# Patient Record
Sex: Male | Born: 2000 | Race: Black or African American | Hispanic: No | Marital: Single | Smoking: Current some day smoker
Health system: Southern US, Community
[De-identification: ages and names within clinical notes are randomized; demographics above are authoritative.]

## PROBLEM LIST (undated history)

## (undated) ENCOUNTER — Ambulatory Visit (HOSPITAL_COMMUNITY): Admission: EM | Disposition: A | Discharge status: Home / Self Care | Payer: Medicaid Other | Source: Home / Self Care

## (undated) DIAGNOSIS — A4902 Methicillin resistant Staphylococcus aureus infection, unspecified site: Secondary | ICD-10-CM

## (undated) DIAGNOSIS — L309 Dermatitis, unspecified: Secondary | ICD-10-CM

## (undated) HISTORY — PX: HERNIA REPAIR: SHX51

---
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
## 2022-08-23 IMAGING — RF DG UGI W/ HIGH DENSITY W/O KUB
7 series · 14 of 24 positions shown · non-contrast
Comparison: None.

CLINICAL DATA: Intermittent epigastric abdominal pain and burning
sensation.

EXAM:
UPPER GI SERIES WITHOUT KUB
TECHNIQUE: Routine upper GI series was performed with thin and high density
barium.
FLUOROSCOPY TIME:  Fluoroscopy Time:  2 minutes 54 seconds
Radiation Exposure Index (if provided by the fluoroscopic device):
158 mGy
Number of Acquired Spot Images: 5

[Series 1: one shot · 0.16mm/px · 5 of 12 slices shown (1 of 3)]
[im 1/12]
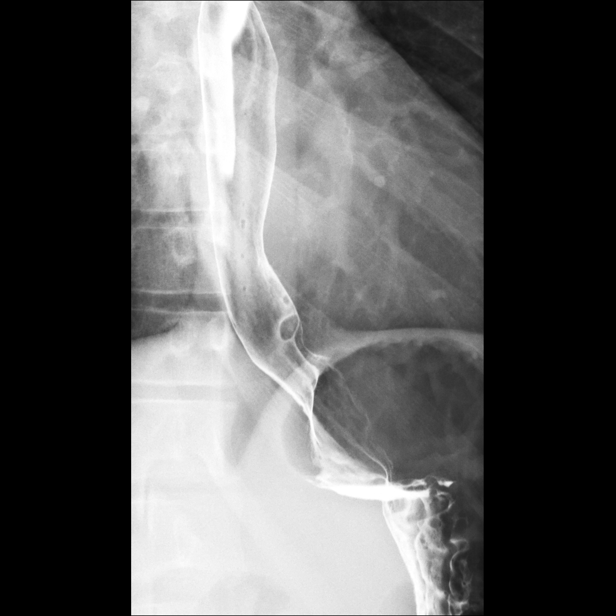
[im 4/12]
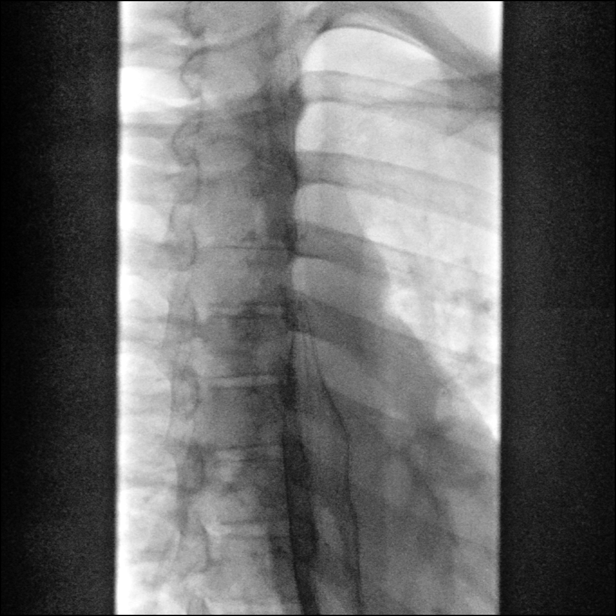
[im 7/12]
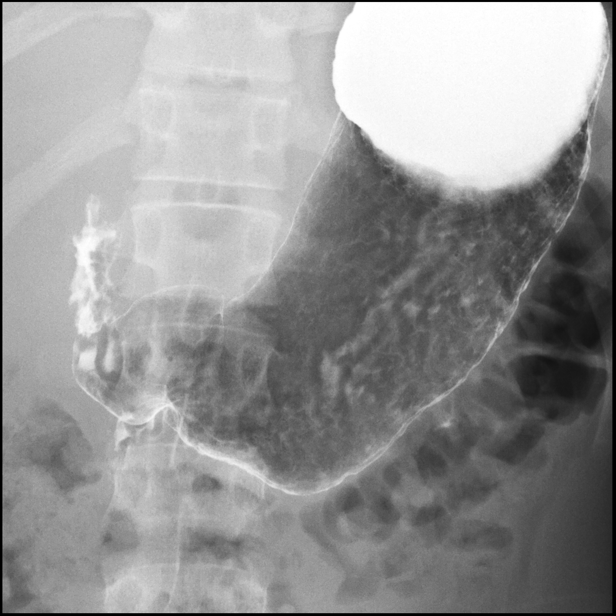
[im 9/12]
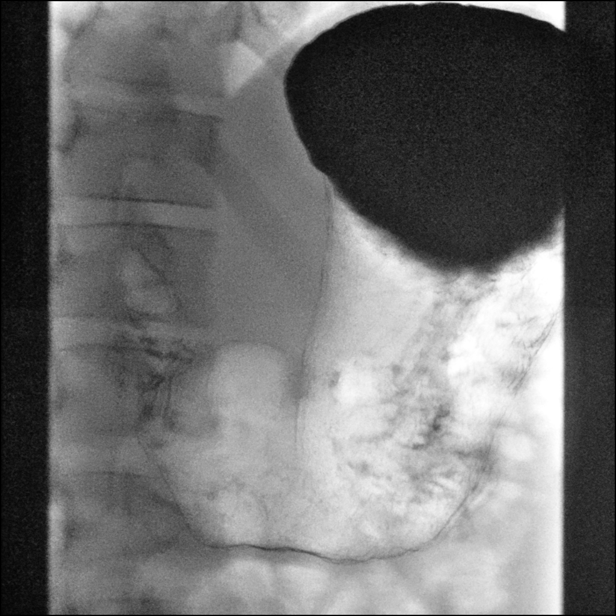
[im 11/12]
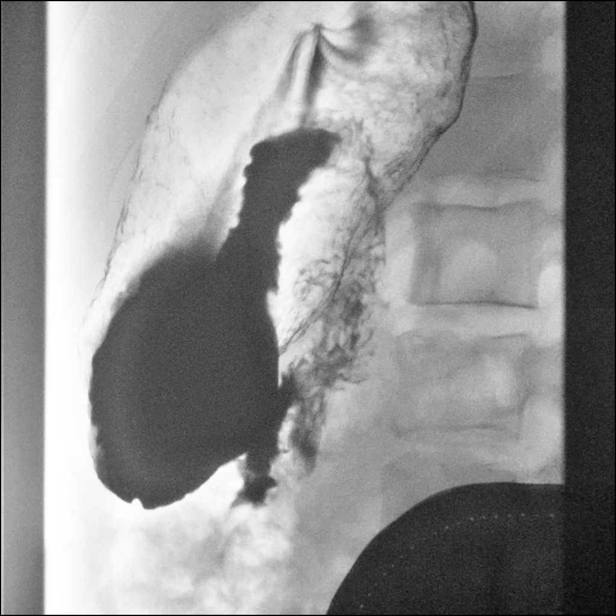

[Series 2: sequence · 2 of 30 frames shown (1 of 4)]
[frame 10/30]
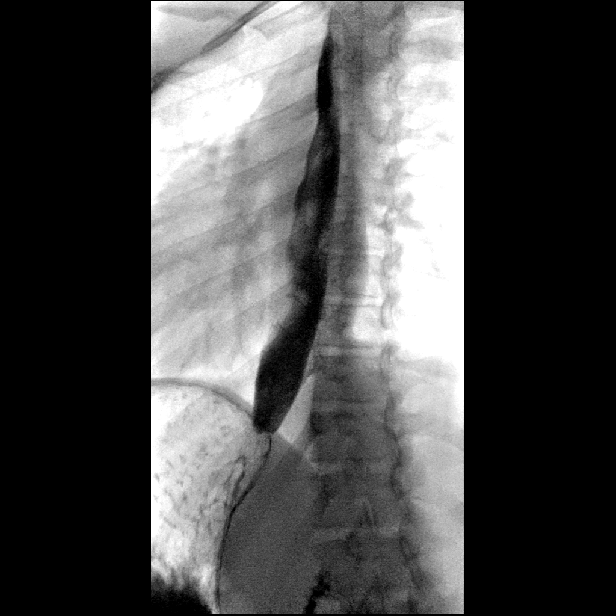
[frame 26/30]
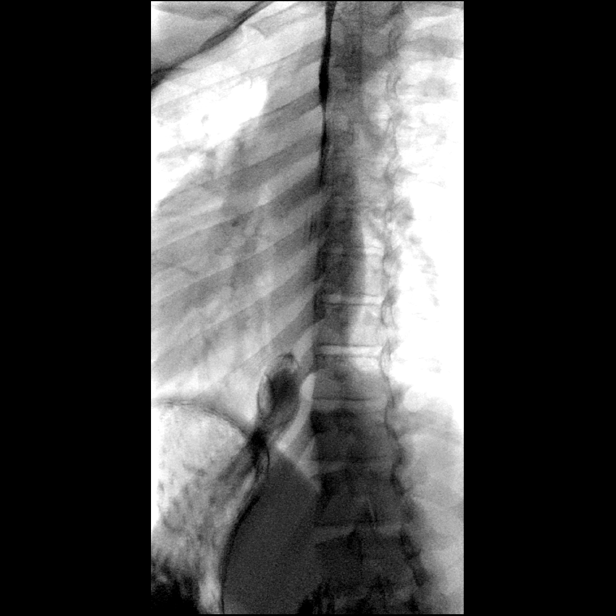

[Series 3: sequence · 2 of 100 frames shown (2 of 4)]
[frame 51/100]
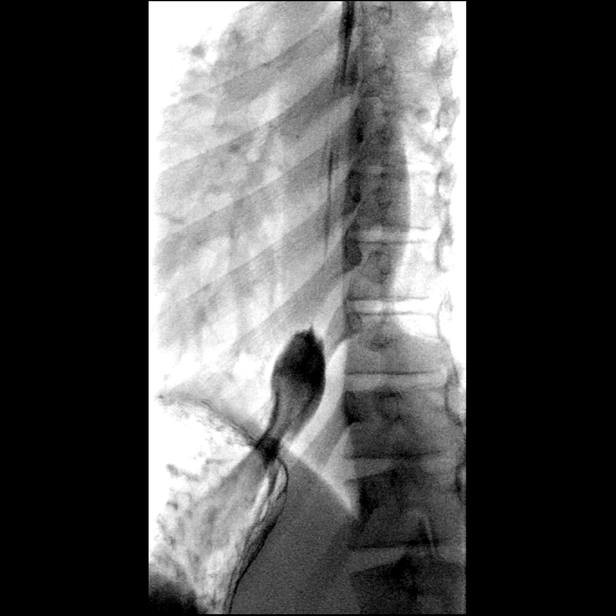
[frame 99/100]
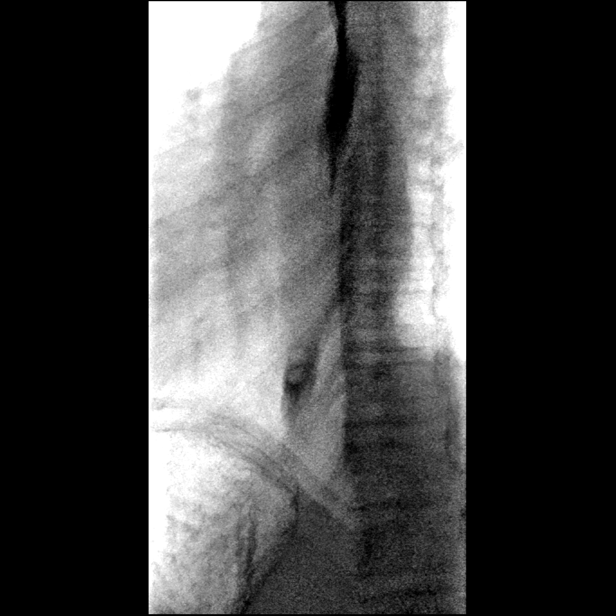

[Series 4: one shot · 1 of 3 slices shown (2 of 3)]
[im 3/3]
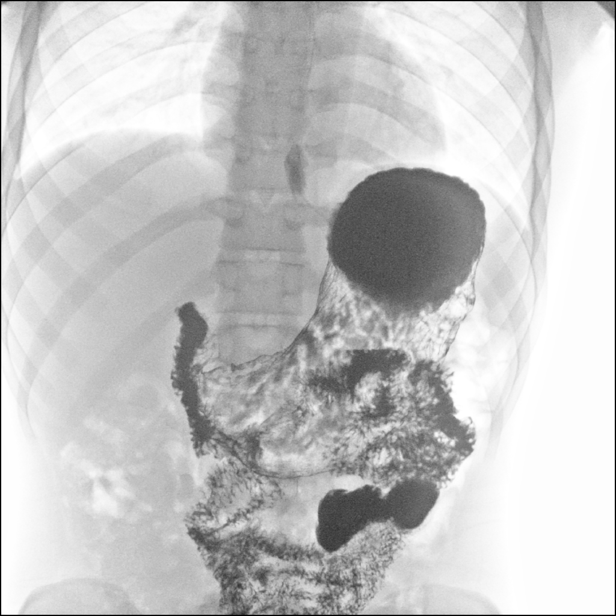

[Series 5: sequence · 2 of 35 frames shown (3 of 4)]
[frame 20/35]
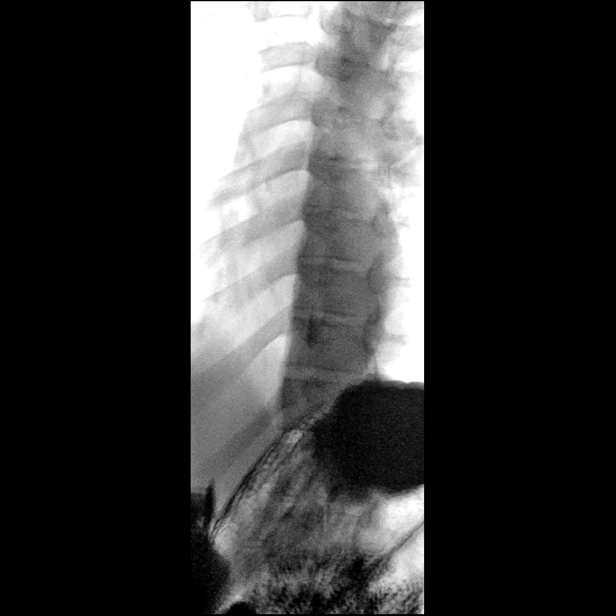
[frame 30/35]
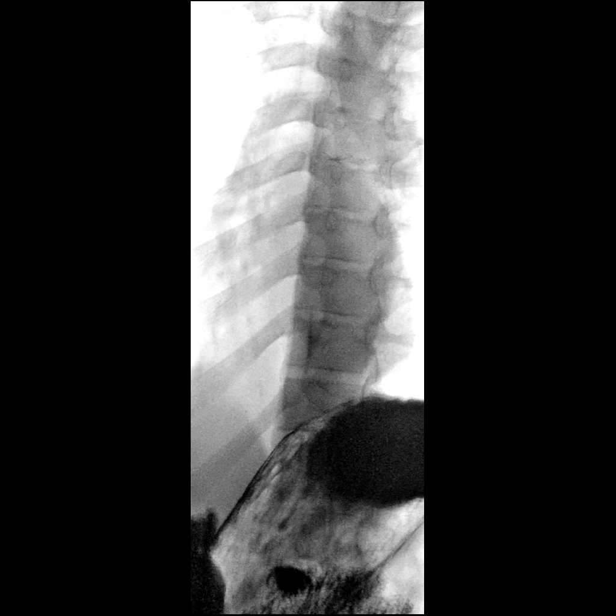

[Series 7: sequence · 1 of 39 frames shown (4 of 4)]
[frame 6/39]
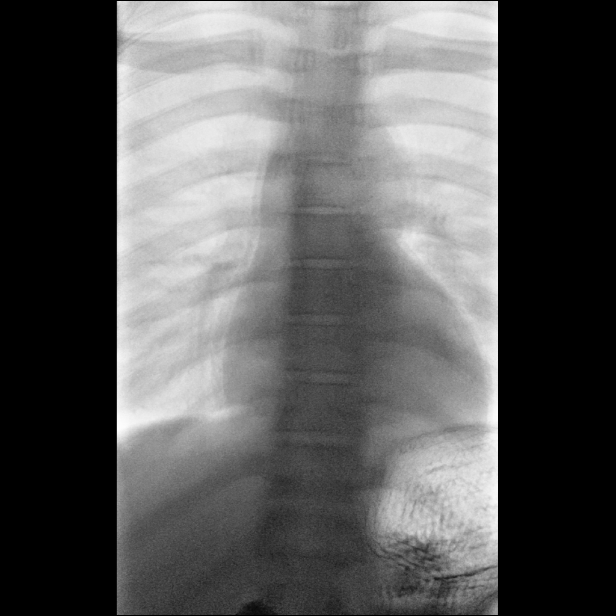

[Series 8: one shot · 1 of 1 slices shown (3 of 3)]
[im 1/1]
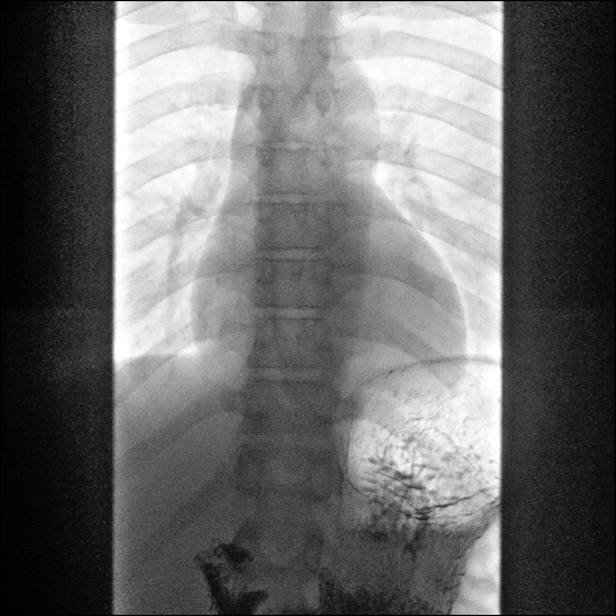

[14 of 24 positions shown; findings below may reference images not displayed]

FINDINGS: Normal esophageal motility. No hiatal hernia. Mild gastroesophageal
reflux elicited to the level of the lower thoracic esophagus with
water siphon test. Normal esophageal mucosa, with no evidence of
reflux esophagitis. Normal esophageal distensibility, with no
evidence of esophageal mass, ulcer or stricture. Barium tablet
traversed the esophagus into the stomach without delay.

No gastric fold thickening. Normal gastric emptying. No evidence of
gastric filling defects, ulcers or significant erosions. Normal
duodenal bulb and C sweep, with no evidence of duodenal fold
thickening, filling defects, ulcers or strictures. Normal duodenal
jejunal junction to the left of the spine. Visualized proximal
jejunal loops are normal caliber and without fold thickening.
IMPRESSION: 1. Mild gastroesophageal reflux elicited.  No hiatal hernia.
2. Otherwise normal upper GI. No evidence of reflux esophagitis.
Normal esophageal motility. No evidence of peptic ulcer disease.

## 2022-09-27 IMAGING — US US ABDOMEN LIMITED RUQ/ASCITES
1 series · 14 of 25 positions shown · non-contrast
Comparison: None.

CLINICAL DATA: Dyspepsia with upper abdominal pain x1 year

EXAM:
ULTRASOUND ABDOMEN LIMITED RIGHT UPPER QUADRANT

[Series 1: us abdomen limited ruq/ascites · 0.17mm/px · 14 of 37 slices shown]
[im 1/37]
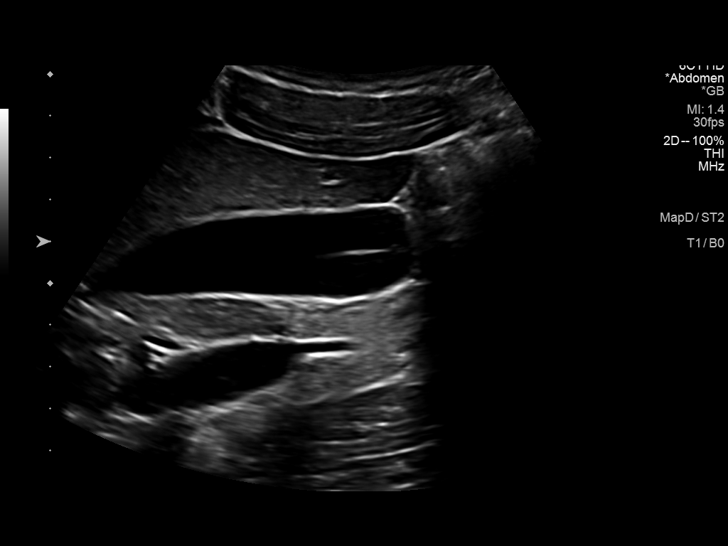
[im 4/37]
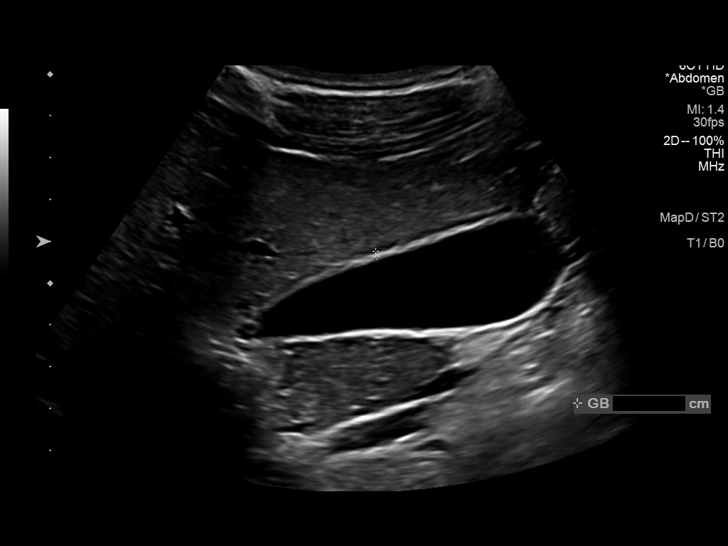
[im 7/37]
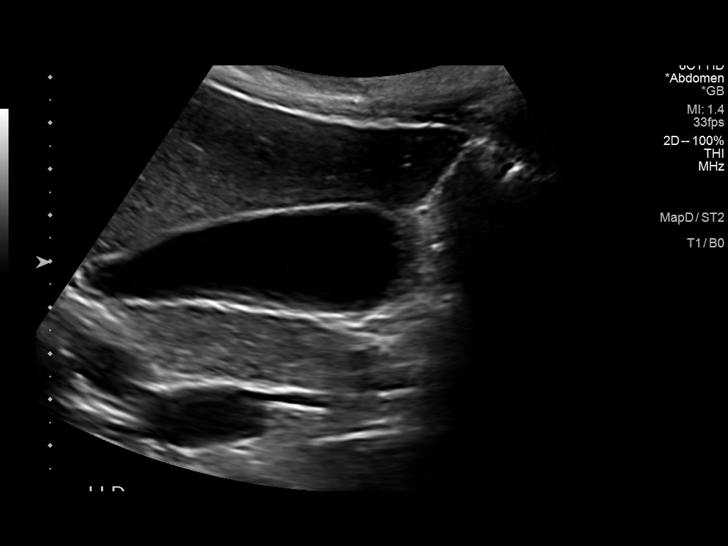
[im 10/37]
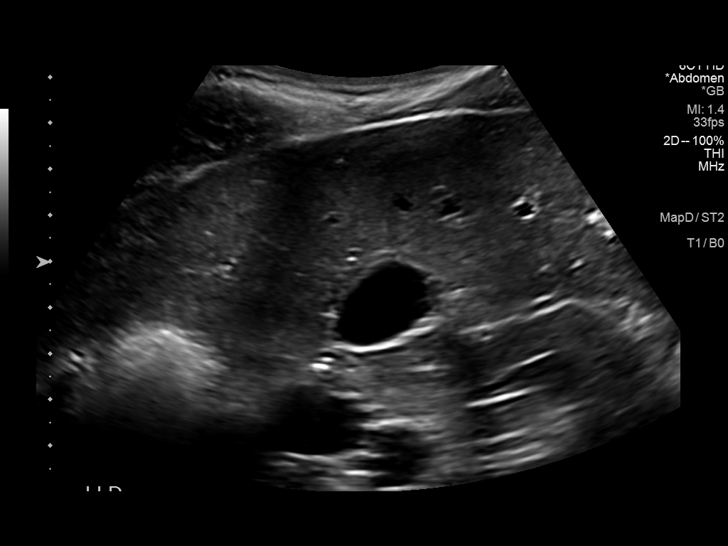
[im 13/37]
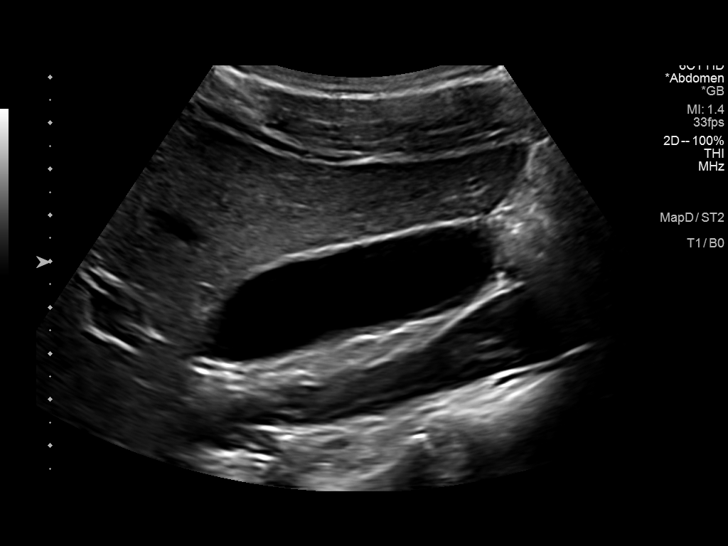
[im 14/37]
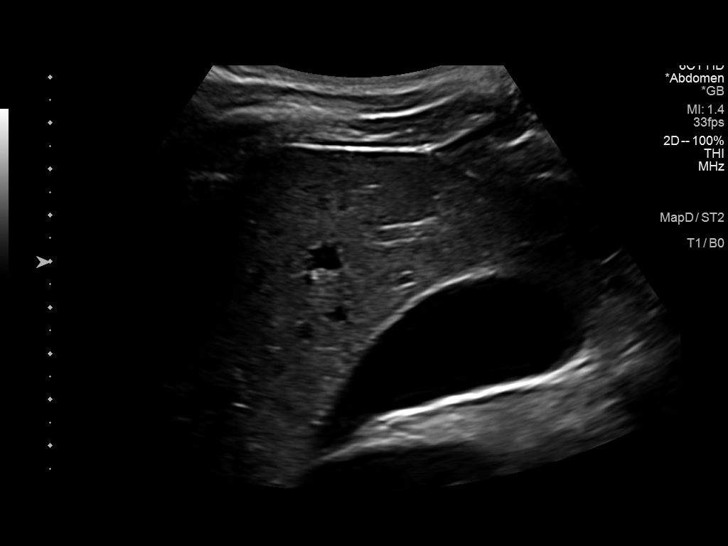
[im 17/37]
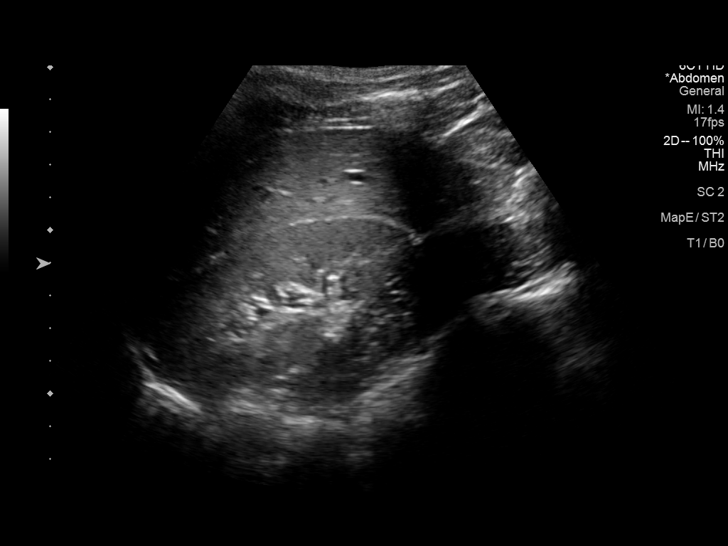
[im 20/37]
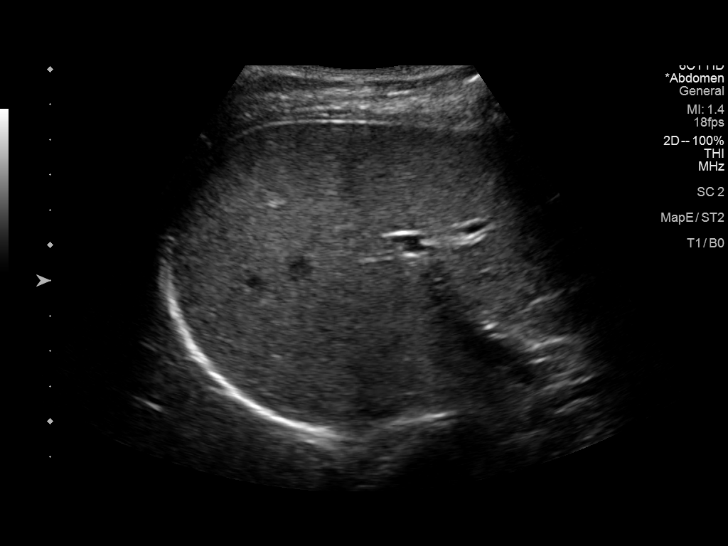
[im 23/37]
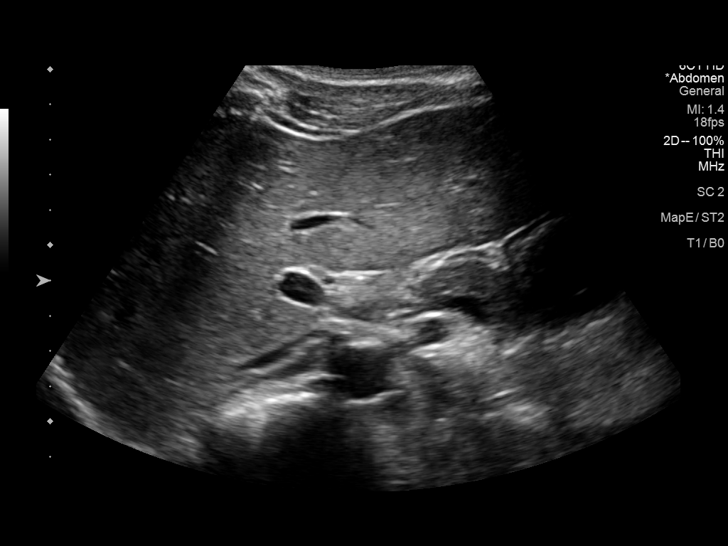
[im 25/37]
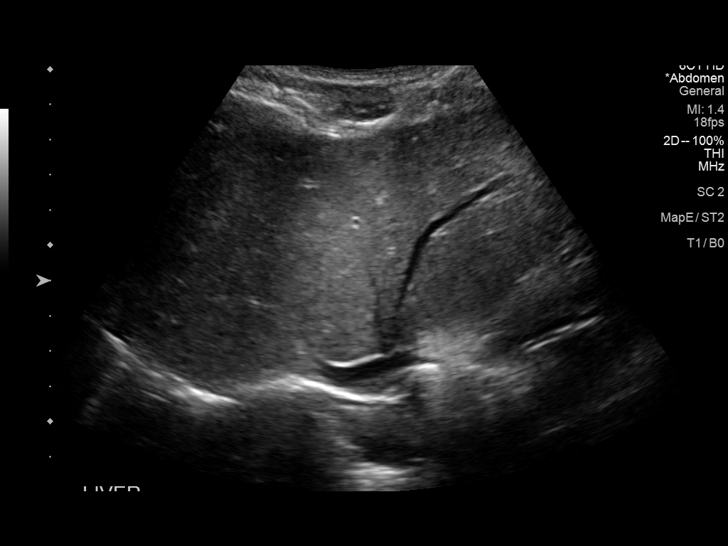
[im 28/37]
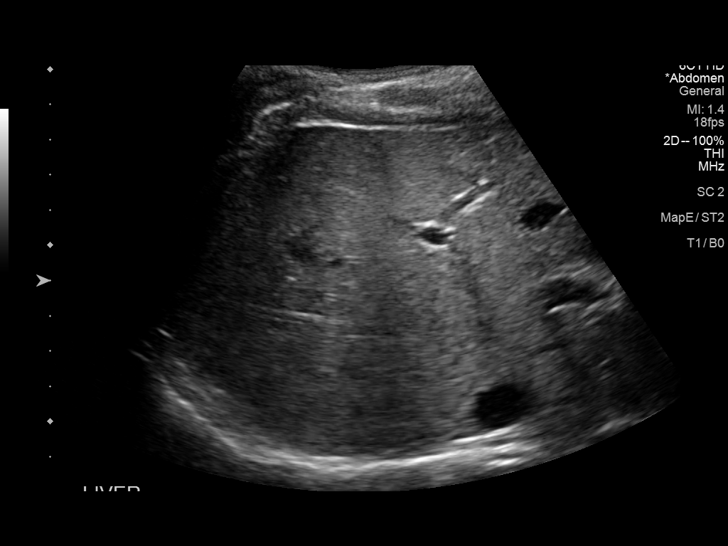
[im 31/37]
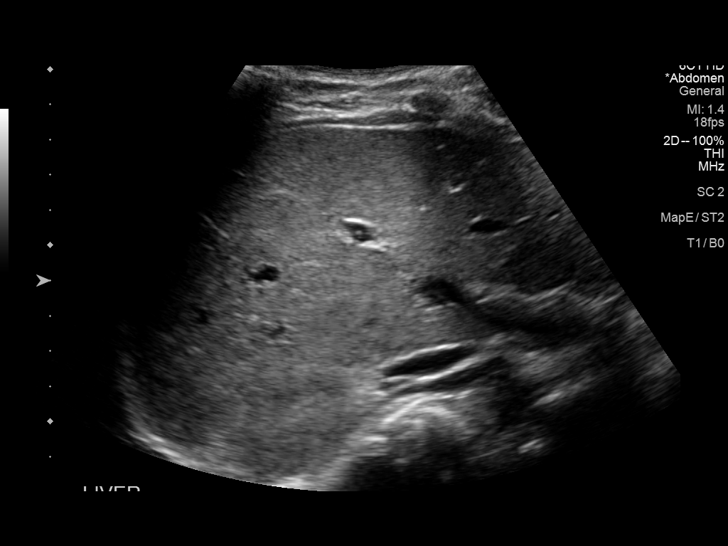
[im 34/37]
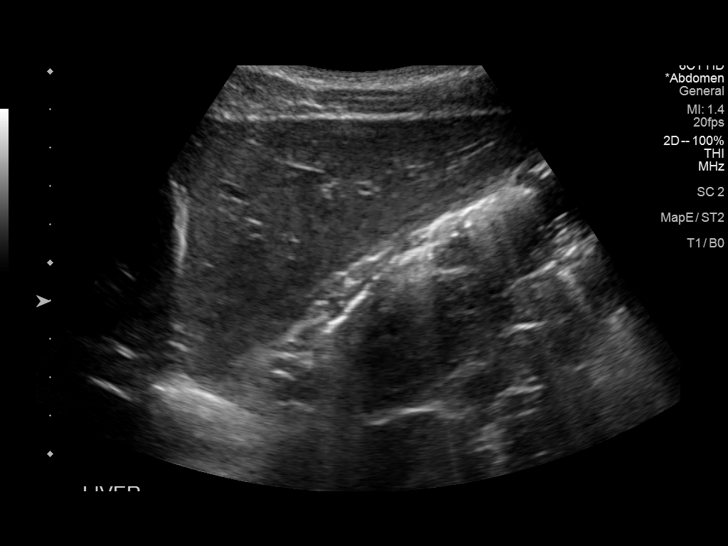
[im 37/37]
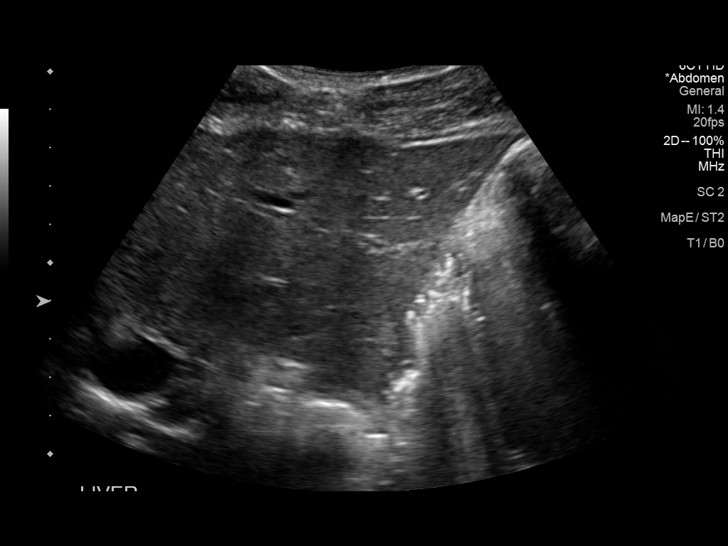

[14 of 25 positions shown; findings below may reference images not displayed]

FINDINGS: Gallbladder:

No gallstones or wall thickening visualized. No sonographic Murphy
sign noted by sonographer.

Common bile duct:

Diameter: 3 mm

Liver:

No focal lesion identified. Within normal limits in parenchymal
echogenicity. Portal vein is patent on color Doppler imaging with
normal direction of blood flow towards the liver.

Other: None.
IMPRESSION: Normal right upper quadrant ultrasound.

## 2022-11-02 ENCOUNTER — Encounter (HOSPITAL_COMMUNITY): Payer: Self-pay

## 2022-11-02 ENCOUNTER — Ambulatory Visit (HOSPITAL_COMMUNITY)
Admission: EM | Admit: 2022-11-02 | Discharge: 2022-11-02 | Disposition: A | Discharge status: Home / Self Care | Payer: Medicaid Other | Attending: Internal Medicine | Admitting: Internal Medicine

## 2022-11-02 VITALS — BP 142/94 | HR 82 | Temp 97.8°F | Resp 18

## 2022-11-02 DIAGNOSIS — K2100 Gastro-esophageal reflux disease with esophagitis, without bleeding: Secondary | ICD-10-CM | POA: Diagnosis not present

## 2022-11-02 LAB — POCT RAPID STREP A, ED / UC: Streptococcus, Group A Screen (Direct): NEGATIVE

## 2022-11-02 MED ORDER — PANTOPRAZOLE SODIUM 20 MG PO TBEC: 20.0000 mg | DELAYED_RELEASE_TABLET | Freq: Every day | ORAL | 0 refills | Status: DC

## 2022-11-02 MED ORDER — ONDANSETRON 4 MG PO TBDP: 4.0000 mg | ORAL_TABLET | Freq: Three times a day (TID) | ORAL | 0 refills | Status: DC | PRN

## 2022-11-02 NOTE — ED Provider Notes (Signed)
[facility]    CSN: [id number] Arrival date & time: 11/02/22  1858      History   Chief Complaint Chief Complaint  Patient presents with   Gastroesophageal Reflux   Sore Throat    HPI Scott [name] is a 22 y.o. male comes to the urgent care with complaints of gastroesophageal reflux symptoms over the past couple of weeks.  Patient has a history of gastroesophageal reflux disease previously prescribed medications but has not been taking it because he felt that the medication was not helping him.  Over the past couple of weeks he has had sore throat with a sensation of swelling in his neck.  Symptoms is worse at night when he lays down to sleep.  He has been taking Advil 2-3 times a week with no improvement in symptoms.  No fever or chills.  No shortness of breath or wheezing.  No abdominal bloating or distention.No sick contacts.  No weight loss.  No alcohol use  HPI  Past Medical History:  Diagnosis Date   Eczema    MRSA (methicillin resistant Staphylococcus aureus)     There are no problems to display for this patient.   Past Surgical History:  Procedure Laterality Date   HERNIA REPAIR         Home Medications    Prior to Admission medications   Medication Sig Start Date End Date Taking? Authorizing Provider  b complex vitamins tablet Take 1 tablet by mouth daily. 12/17/19  Yes Teressa Lower, MD  cetirizine (ZYRTEC) 10 MG tablet Take 1 tablet (10 mg total) by mouth at bedtime. 02/02/14  Yes Brewer, Leslye Peer, NP  hydrocortisone 2.5 % cream Apply topically 2 (two) times daily. To face 02/02/14  Yes Brewer, Mindy, NP  ondansetron (ZOFRAN-ODT) 4 MG disintegrating tablet Take 1 tablet (4 mg total) by mouth every 8 (eight) hours as needed for nausea or vomiting. 11/02/22  Yes Agatha Duplechain, Myrene Galas, MD  pantoprazole (PROTONIX) 20 MG tablet Take 1 tablet (20 mg total) by mouth daily. 11/02/22  Yes Sanayah Munro, Myrene Galas, MD  triamcinolone cream (KENALOG) 0.1 % Apply 1  application topically 2 (two) times daily. To body 02/02/14  Yes Brewer, Leslye Peer, NP  NAPROXEN PO Take by mouth.    [provider]  Olopatadine HCl 0.2 % SOLN Place 1 drop into the left eye daily. 02/02/14   Kristen Cardinal, NP    Family History Family History  Problem Relation Age of Onset   Migraines Maternal Grandmother    Seizures Neg Hx    Autism Neg Hx    ADD / ADHD Neg Hx    Anxiety disorder Neg Hx    Depression Neg Hx    Bipolar disorder Neg Hx    Schizophrenia Neg Hx     Social History Social History   Tobacco Use   Smoking status: Passive Smoke Exposure - Never Smoker   Smokeless tobacco: Never     Allergies   Patient has no known allergies.   Review of Systems Review of Systems As per HPI  Physical Exam Triage Vital Signs ED Triage Vitals [11/02/22 1914]  Enc Vitals Group     BP (!) 142/94     Pulse Rate 82     Resp 18     Temp 97.8 F (36.6 C)     Temp Source Oral     SpO2 98 %     Weight      Height  Head Circumference      Peak Flow      Pain Score      Pain Loc      Pain Edu?      Excl. in [place]?    No data found.  Updated Vital Signs BP (!) 142/94 (BP Location: Left Arm)   Pulse 82   Temp 97.8 F (36.6 C) (Oral)   Resp 18   SpO2 98%   Visual Acuity Right Eye Distance:   Left Eye Distance:   Bilateral Distance:    Right Eye Near:   Left Eye Near:    Bilateral Near:     Physical Exam Vitals and nursing note reviewed.  Constitutional:      General: He is not in acute distress.    Appearance: He is well-developed. He is not ill-appearing.  HENT:     Right Ear: Tympanic membrane normal.     Left Ear: Tympanic membrane normal.     Nose: No congestion or rhinorrhea.     Mouth/Throat:     Mouth: Mucous membranes are moist. No oral lesions.     Pharynx: No pharyngeal swelling or posterior oropharyngeal erythema.     Tonsils: No tonsillar exudate or tonsillar abscesses. 1+ on the right. 1+ on the left.  Cardiovascular:      Rate and Rhythm: Normal rate and regular rhythm.  Pulmonary:     Effort: Pulmonary effort is normal.     Breath sounds: Normal breath sounds.  Abdominal:     General: Bowel sounds are normal.     Palpations: Abdomen is soft.  Neurological:     Mental Status: He is alert.      UC Treatments / Results  Labs (all labs ordered are listed, but only abnormal results are displayed) Labs Reviewed  POCT RAPID STREP A, ED / UC  POCT RAPID STREP A, ED / UC    EKG   Radiology No results found.  Procedures Procedures (including critical care time)  Medications Ordered in UC Medications - No data to display  Initial Impression / Assessment and Plan / UC Course  I have reviewed the triage vital signs and the nursing notes.  Pertinent labs & imaging results that were available during my care of the patient were reviewed by me and considered in my medical decision making (see chart for details).     1.  Gastroesophageal reflux disease: Patient is advised to stop taking Advil Patient is advised to eat at least 2 hours prior to bedtime Protonix 20 mg orally daily Zofran as needed for nausea/vomiting Maintain adequate hydration Return precautions given. Final Clinical Impressions(s) / UC Diagnoses   Final diagnoses:  Gastroesophageal reflux disease with esophagitis without hemorrhage     Discharge Instructions      Please avoid taking ibuprofen, Advil or Aleve.  Please take Tylenol extra strength for pain Please eat at least 2 hours before your bedtime Please take medications as prescribed If you have worsening symptoms please return to urgent care to be reevaluated.      ED Prescriptions     Medication Sig Dispense Auth. Provider   pantoprazole (PROTONIX) 20 MG tablet Take 1 tablet (20 mg total) by mouth daily. 60 tablet Jacquese Cassarino, Myrene Galas, MD   ondansetron (ZOFRAN-ODT) 4 MG disintegrating tablet Take 1 tablet (4 mg total) by mouth every 8 (eight) hours as  needed for nausea or vomiting. 20 tablet Tyneka Scafidi, Myrene Galas, MD      PDMP not reviewed this  encounter.   Chase Picket, MD 11/03/22 [phone]

## 2022-11-02 NOTE — ED Triage Notes (Signed)
2- week h/o swollen lymph nodes and sore throat. Pt is requesting medication for his reflux.

## 2022-11-02 NOTE — Discharge Instructions (Addendum)
Please avoid taking ibuprofen, Advil or Aleve.  Please take Tylenol extra strength for pain Please eat at least 2 hours before your bedtime Please take medications as prescribed If you have worsening symptoms please return to urgent care to be reevaluated.

## 2022-11-17 ENCOUNTER — Ambulatory Visit (HOSPITAL_COMMUNITY)
Admission: EM | Admit: 2022-11-17 | Discharge: 2022-11-17 | Disposition: A | Discharge status: Home / Self Care | Payer: Medicaid Other

## 2022-11-17 ENCOUNTER — Encounter (HOSPITAL_COMMUNITY): Payer: Self-pay

## 2022-11-17 ENCOUNTER — Emergency Department (HOSPITAL_COMMUNITY)
Admission: EM | Admit: 2022-11-17 | Discharge: 2022-11-17 | Disposition: A | Discharge status: Home / Self Care | Payer: Medicaid Other | Attending: Emergency Medicine | Admitting: Emergency Medicine

## 2022-11-17 DIAGNOSIS — R519 Headache, unspecified: Secondary | ICD-10-CM

## 2022-11-17 DIAGNOSIS — R0981 Nasal congestion: Secondary | ICD-10-CM | POA: Insufficient documentation

## 2022-11-17 DIAGNOSIS — R040 Epistaxis: Secondary | ICD-10-CM | POA: Insufficient documentation

## 2022-11-17 NOTE — ED Provider Notes (Signed)
[facility] EMERGENCY DEPARTMENT AT [facility] Provider Note   CSN: [id number] Arrival date & time: 11/17/22  1459     History  Chief Complaint  Patient presents with   Epistaxis    Scott [name] is a 22 y.o. male with noncontributory past medical history who presents with concern for nosebleed since this morning.  Patient is congested in triage.  He also endorses that he has had a headache for about 2 days.  Patient was sent over from urgent care because he was unable to stop the nosebleed and they were concerned about possible posterior bleed.  At time my evaluation nosebleed is completely resolved.   Epistaxis      Home Medications Prior to Admission medications   Medication Sig Start Date End Date Taking? Authorizing Provider  b complex vitamins tablet Take 1 tablet by mouth daily. 12/17/19   Keturah Shavers, MD  hydrocortisone 2.5 % cream Apply topically 2 (two) times daily. To face 02/02/14   Lowanda Foster, NP  NAPROXEN PO Take by mouth.    [provider]  Olopatadine HCl 0.2 % SOLN Place 1 drop into the left eye daily. 02/02/14   Lowanda Foster, NP  ondansetron (ZOFRAN-ODT) 4 MG disintegrating tablet Take 1 tablet (4 mg total) by mouth every 8 (eight) hours as needed for nausea or vomiting. 11/02/22   Merrilee Jansky, MD  pantoprazole (PROTONIX) 20 MG tablet Take 1 tablet (20 mg total) by mouth daily. 11/02/22   LampteyBritta Mccreedy, MD  triamcinolone cream (KENALOG) 0.1 % Apply 1 application topically 2 (two) times daily. To body 02/02/14   Lowanda Foster, NP      Allergies    Shiitake mushroom    Review of Systems   Review of Systems  HENT:  Positive for nosebleeds.   All other systems reviewed and are negative.   Physical Exam Updated Vital Signs BP (!) 129/94 (BP Location: Right Arm)   Pulse 78   Temp 98.4 F (36.9 C) (Oral)   Resp 18   Ht 5\' 5"  (1.651 m)   Wt 70.3 kg   SpO2 94%   BMI 25.79 kg/m  Physical Exam Vitals and nursing note  reviewed.  Constitutional:      General: He is not in acute distress.    Appearance: Normal appearance.  HENT:     Head: Normocephalic and atraumatic.     Nose:     Comments: No bleeding from anterior nares bilaterally. Some crusted blood noted at anterior of right nare.    Mouth/Throat:     Comments: No significant posterior oropharynx erythema, swelling, exudate. Uvula midline, tonsils 1+ bilaterally.  No trismus, stridor, evidence of PTA, floor of mouth swelling or redness.   No bleeding coming from posterior nasopharynx down throat on my exam Eyes:     General:        Right eye: No discharge.        Left eye: No discharge.  Cardiovascular:     Rate and Rhythm: Normal rate and regular rhythm.  Pulmonary:     Effort: Pulmonary effort is normal. No respiratory distress.  Musculoskeletal:        General: No deformity.  Skin:    General: Skin is warm and dry.  Neurological:     Mental Status: He is alert and oriented to person, place, and time.  Psychiatric:        Mood and Affect: Mood normal.  Behavior: Behavior normal.     ED Results / Procedures / Treatments   Labs (all labs ordered are listed, but only abnormal results are displayed) Labs Reviewed - No data to display  EKG None  Radiology No results found.  Procedures Procedures    Medications Ordered in ED Medications - No data to display  ED Course/ Medical Decision Making/ A&P                             Medical Decision Making  This patient is a 22 y.o. male who presents to the ED for concern of Nosebleed with concern for posterior epistaxis versus other from urgent care with inability to control bleeding at urgent care..   Differential diagnoses prior to evaluation: Anterior epistaxis, posterior epistaxis, clotting disorder, versus other  Past Medical History / Social History / Additional history: Chart reviewed. Pertinent results include: Noncontributory  Physical Exam: Physical exam  performed. The pertinent findings include: Patient with clear posterior oropharynx, no evidence of bleeding from nasopharynx, anterior nose with some dried crusted blood in the right nare but no active nosebleed bilaterally.  His vital signs are overall stable, mildly hypertensive diastolic blood pressure at 129/94.  Other vital signs stable.  Patient does not appear overall pale, he is not dizzy, answers all questions appropriately.  Medications / Treatment: Discussed with patient that I would not recommend any additional treatment with epistaxis resolved at this time, encouraged bilateral Vaseline of nostrils for the next week to prevent recurrent nosebleeds   Disposition: After consideration of the diagnostic results and the patients response to treatment, I feel that patient with resolved epistaxis, no additional treatment needed at this time.  He reports mild headache which sounds tension type as it is bandlike, pressure, resolves with treatment, he denies any focal vision changes, numbness, tingling, or other neurologic deficits.Marland Kitchen   emergency department workup does not suggest an emergent condition requiring admission or immediate intervention beyond what has been performed at this time. The plan is: as above. The patient is safe for discharge and has been instructed to return immediately for worsening symptoms, change in symptoms or any other concerns.  Final Clinical Impression(s) / ED Diagnoses Final diagnoses:  Epistaxis  Nasal congestion  Acute nonintractable headache, unspecified headache type    Rx / DC Orders ED Discharge Orders     None         [place] 11/17/22 1550    Terrilee Files, MD 11/18/22 1028

## 2022-11-17 NOTE — Discharge Instructions (Addendum)
Please go to the emergency department for further evaluation.

## 2022-11-17 NOTE — ED Notes (Signed)
Pt left prior to this RN being able to review discharge papers with him.

## 2022-11-17 NOTE — Discharge Instructions (Signed)
Swab the inside of your nostrils 3-4 times daily for the next week with Vaseline to help prevent any blood vessels from starting to bleed again.  Need to have any sensation of swelling in the back of your throat I recommend following up with your PCP or talking to an ENT doctor about this, or return to the emergency department if you are having difficulty breathing.

## 2022-11-17 NOTE — ED Provider Notes (Signed)
[place]    CSN: [id number] Arrival date & time: 11/17/22  1405      History   Chief Complaint Chief Complaint  Patient presents with   Headache   Epistaxis    HPI Scott [name] is a 22 y.o. male.  Presents with nosebleed that began today around 8am He blew his nose and had bleeding from both nostrils. It stopped for a few seconds with direct pressure, and then continued.  Reports 8/10 headache today. Not the worst of his life. No head injury or trauma. No meds taken Denies recent illness or nasal congestion   Denies history of nosebleed No drug use  Past Medical History:  Diagnosis Date   Eczema    MRSA (methicillin resistant Staphylococcus aureus)     There are no problems to display for this patient.   Past Surgical History:  Procedure Laterality Date   HERNIA REPAIR       Home Medications    Prior to Admission medications   Medication Sig Start Date End Date Taking? Authorizing Provider  b complex vitamins tablet Take 1 tablet by mouth daily. 12/17/19   Teressa Lower, MD  hydrocortisone 2.5 % cream Apply topically 2 (two) times daily. To face 02/02/14   Kristen Cardinal, NP  NAPROXEN PO Take by mouth.    [provider]  Olopatadine HCl 0.2 % SOLN Place 1 drop into the left eye daily. 02/02/14   Kristen Cardinal, NP  ondansetron (ZOFRAN-ODT) 4 MG disintegrating tablet Take 1 tablet (4 mg total) by mouth every 8 (eight) hours as needed for nausea or vomiting. 11/02/22   Chase Picket, MD  pantoprazole (PROTONIX) 20 MG tablet Take 1 tablet (20 mg total) by mouth daily. 11/02/22   LampteyMyrene Galas, MD  triamcinolone cream (KENALOG) 0.1 % Apply 1 application topically 2 (two) times daily. To body 02/02/14   Kristen Cardinal, NP    Family History Family History  Problem Relation Age of Onset   Migraines Maternal Grandmother    Seizures Neg Hx    Autism Neg Hx    ADD / ADHD Neg Hx    Anxiety disorder Neg Hx    Depression Neg Hx     Bipolar disorder Neg Hx    Schizophrenia Neg Hx     Social History Social History   Tobacco Use   Smoking status: Some Days    Types: Cigarettes    Passive exposure: Yes   Smokeless tobacco: Never  Vaping Use   Vaping Use: Never used  Substance Use Topics   Alcohol use: Never   Drug use: Never     Allergies   Patient has no known allergies.   Review of Systems Review of Systems  HENT:  Positive for nosebleeds.   Neurological:  Positive for headaches.   As per HPI  Physical Exam Triage Vital Signs ED Triage Vitals [11/17/22 1417]  Enc Vitals Group     BP 128/83     Pulse Rate 79     Resp 16     Temp 98.3 F (36.8 C)     Temp Source Oral     SpO2 97 %     Weight      Height      Head Circumference      Peak Flow      Pain Score 7     Pain Loc      Pain Edu?      Excl. in [place]?  No data found.  Updated Vital Signs BP 128/83 (BP Location: Right Arm)   Pulse 79   Temp 98.3 F (36.8 C) (Oral)   Resp 16   SpO2 97%     Physical Exam Vitals and nursing note reviewed.  Constitutional:      General: He is not in acute distress. HENT:     Nose:     Right Nostril: Epistaxis present. No foreign body.     Left Nostril: Epistaxis present. No foreign body.     Right Turbinates: Swollen.     Left Turbinates: Swollen.     Comments: Cannot visualize anterior source. Light bleeding after direct pressure applied. No nasal trauma or deformity noted    Mouth/Throat:     Mouth: Mucous membranes are moist.     Pharynx: Oropharynx is clear.  Cardiovascular:     Rate and Rhythm: Normal rate and regular rhythm.     Pulses: Normal pulses.  Pulmonary:     Effort: Pulmonary effort is normal.  Neurological:     Mental Status: He is alert and oriented to person, place, and time.     UC Treatments / Results  Labs (all labs ordered are listed, but only abnormal results are displayed) Labs Reviewed - No data to display  EKG   Radiology No results  found.  Procedures Procedures (including critical care time)  Medications Ordered in UC Medications - No data to display  Initial Impression / Assessment and Plan / UC Course  I have reviewed the triage vital signs and the nursing notes.  Pertinent labs & imaging results that were available during my care of the patient were reviewed by me and considered in my medical decision making (see chart for details).  Assessed after 15 minutes of direct pressure There is light drainage from the nose, a few drops every few seconds. Not bright red blood. Has a dark brown and clear color tinge to it. Cannot visualize any anterior bleed source Concern for posterior bleed, color of drainage, associated headache. Sent to ED for higher level of care. Accompanied by dad  Final Clinical Impressions(s) / UC Diagnoses   Final diagnoses:  Epistaxis  Bad headache     Discharge Instructions      Please go to the emergency department for further evaluation.     ED Prescriptions   None    PDMP not reviewed this encounter.   Les Pou, [place] 11/17/22 1455

## 2022-11-17 NOTE — ED Notes (Signed)
Patient is being discharged from the Urgent Care and sent to the Emergency Department via POV . Per Wells Guiles Rising, PA-C , patient is in need of higher level of care due to nose bleed. Patient is aware and verbalizes understanding of plan of care.  Vitals:   11/17/22 1417  BP: 128/83  Pulse: 79  Resp: 16  Temp: 98.3 F (36.8 C)  SpO2: 97%

## 2022-11-17 NOTE — ED Triage Notes (Signed)
Patient c/o headache x 2 days. Patient states his nose began bleeding at 0800 today and stopped a couple of times, but restarted. Patient was actively bleeding in the lobby. Patient given gauze and instructed to pinch his nose and lean forward.

## 2022-11-17 NOTE — ED Triage Notes (Signed)
Pt to ED from urgent care c/o nose bleed that started this morning. , bleeding controlled in triage. Pt also c/o HA X 2 days.

## 2024-05-06 ENCOUNTER — Emergency Department (HOSPITAL_COMMUNITY)

## 2024-05-06 ENCOUNTER — Emergency Department (HOSPITAL_COMMUNITY): Admitting: Registered Nurse

## 2024-05-06 ENCOUNTER — Observation Stay (HOSPITAL_COMMUNITY)
Admission: EM | Admit: 2024-05-06 | Discharge: 2024-05-07 | Disposition: A | Discharge status: Home / Self Care | Attending: Orthopedic Surgery | Admitting: Orthopedic Surgery

## 2024-05-06 ENCOUNTER — Encounter (HOSPITAL_COMMUNITY)
Admission: EM | Disposition: A | Discharge status: Home / Self Care | Payer: Self-pay | Source: Home / Self Care | Attending: Emergency Medicine

## 2024-05-06 DIAGNOSIS — S50312A Abrasion of left elbow, initial encounter: Secondary | ICD-10-CM | POA: Insufficient documentation

## 2024-05-06 DIAGNOSIS — S82892B Other fracture of left lower leg, initial encounter for open fracture type I or II: Principal | ICD-10-CM | POA: Diagnosis present

## 2024-05-06 DIAGNOSIS — S50311A Abrasion of right elbow, initial encounter: Secondary | ICD-10-CM | POA: Insufficient documentation

## 2024-05-06 DIAGNOSIS — S82842B Displaced bimalleolar fracture of left lower leg, initial encounter for open fracture type I or II: Secondary | ICD-10-CM | POA: Diagnosis not present

## 2024-05-06 DIAGNOSIS — S80211A Abrasion, right knee, initial encounter: Secondary | ICD-10-CM | POA: Insufficient documentation

## 2024-05-06 DIAGNOSIS — F17210 Nicotine dependence, cigarettes, uncomplicated: Secondary | ICD-10-CM | POA: Diagnosis not present

## 2024-05-06 DIAGNOSIS — M25572 Pain in left ankle and joints of left foot: Secondary | ICD-10-CM | POA: Diagnosis present

## 2024-05-06 DIAGNOSIS — S80212A Abrasion, left knee, initial encounter: Secondary | ICD-10-CM | POA: Insufficient documentation

## 2024-05-06 DIAGNOSIS — Z9889 Other specified postprocedural states: Secondary | ICD-10-CM

## 2024-05-06 HISTORY — PX: ORIF ANKLE FRACTURE: SHX5408

## 2024-05-06 LAB — SAMPLE TO BLOOD BANK

## 2024-05-06 LAB — COMPREHENSIVE METABOLIC PANEL WITH GFR
ALT: 22 U/L (ref 0–44)
AST: 34 U/L (ref 15–41)
Albumin: 4.3 g/dL (ref 3.5–5.0)
Alkaline Phosphatase: 87 U/L (ref 38–126)
Anion gap: 14 (ref 5–15)
BUN: 11 mg/dL (ref 6–20)
CO2: 21 mmol/L — ABNORMAL LOW (ref 22–32)
Calcium: 9.5 mg/dL (ref 8.9–10.3)
Chloride: 101 mmol/L (ref 98–111)
Creatinine, Ser: 1.22 mg/dL (ref 0.61–1.24)
GFR, Estimated: >60 mL/min (ref >60)
Glucose, Bld: 128 mg/dL — ABNORMAL HIGH (ref 70–99)
Potassium: 3.9 mmol/L (ref 3.5–5.1)
Sodium: 136 mmol/L (ref 135–145)
Total Bilirubin: 0.8 mg/dL (ref 0.0–1.2)
Total Protein: 7.3 g/dL (ref 6.5–8.1)

## 2024-05-06 LAB — I-STAT CHEM 8, ED
BUN: 12 mg/dL (ref 6–20)
Calcium, Ion: 1.06 mmol/L — ABNORMAL LOW (ref 1.15–1.40)
Chloride: 102 mmol/L (ref 98–111)
Creatinine, Ser: 1.1 mg/dL (ref 0.61–1.24)
Glucose, Bld: 129 mg/dL — ABNORMAL HIGH (ref 70–99)
HCT: 45 % (ref 39.0–52.0)
Hemoglobin: 15.3 g/dL (ref 13.0–17.0)
Potassium: 3.8 mmol/L (ref 3.5–5.1)
Sodium: 137 mmol/L (ref 135–145)
TCO2: 22 mmol/L (ref 22–32)

## 2024-05-06 LAB — ETHANOL: Alcohol, Ethyl (B): <15 mg/dL (ref <15)

## 2024-05-06 LAB — I-STAT CG4 LACTIC ACID, ED: Lactic Acid, Venous: 4.1 mmol/L (ref 0.5–1.9)

## 2024-05-06 LAB — CBC
HCT: 44 % (ref 39.0–52.0)
Hemoglobin: 14.5 g/dL (ref 13.0–17.0)
MCH: 28.7 pg (ref 26.0–34.0)
MCHC: 33 g/dL (ref 30.0–36.0)
MCV: 87 fL (ref 80.0–100.0)
Platelets: 298 K/uL (ref 150–400)
RBC: 5.06 MIL/uL (ref 4.22–5.81)
RDW: 13.1 % (ref 11.5–15.5)
WBC: 7.7 K/uL (ref 4.0–10.5)
nRBC: 0 % (ref 0.0–0.2)

## 2024-05-06 SURGERY — OPEN REDUCTION INTERNAL FIXATION (ORIF) ANKLE FRACTURE
Anesthesia: General | Site: Ankle | Laterality: Left

## 2024-05-06 MED ORDER — MORPHINE SULFATE (PF) 4 MG/ML IV SOLN
4.0000 mg | Freq: Once | INTRAVENOUS | Status: AC
  Administered 2024-05-06 (×2): 4 mg via INTRAVENOUS
  Filled 2024-05-06: qty 1

## 2024-05-06 MED ORDER — LACTATED RINGERS IV SOLN: INTRAVENOUS | Status: DC

## 2024-05-06 MED ORDER — DROPERIDOL 2.5 MG/ML IJ SOLN: 0.6250 mg | Freq: Once | INTRAMUSCULAR | Status: DC | PRN

## 2024-05-06 MED ORDER — ROCURONIUM BROMIDE 10 MG/ML (PF) SYRINGE
PREFILLED_SYRINGE | INTRAVENOUS | Status: DC | PRN
  Administered 2024-05-06 (×2): 30 mg via INTRAVENOUS

## 2024-05-06 MED ORDER — FENTANYL CITRATE (PF) 250 MCG/5ML IJ SOLN
INTRAMUSCULAR | Status: DC | PRN
  Administered 2024-05-06: 100 ug via INTRAVENOUS
  Administered 2024-05-06 (×2): 50 ug via INTRAVENOUS
  Administered 2024-05-06: 100 ug via INTRAVENOUS
  Administered 2024-05-07 (×2): 50 ug via INTRAVENOUS

## 2024-05-06 MED ORDER — DEXAMETHASONE SODIUM PHOSPHATE 10 MG/ML IJ SOLN
INTRAMUSCULAR | Status: DC | PRN
  Administered 2024-05-06 (×2): 5 mg via INTRAVENOUS

## 2024-05-06 MED ORDER — TETANUS-DIPHTH-ACELL PERTUSSIS 5-2.5-18.5 LF-MCG/0.5 IM SUSY: 0.5000 mL | PREFILLED_SYRINGE | Freq: Once | INTRAMUSCULAR | Status: AC

## 2024-05-06 MED ORDER — FENTANYL CITRATE (PF) 250 MCG/5ML IJ SOLN
INTRAMUSCULAR | Status: AC
Start: 2024-05-06 — End: 2024-05-06
  Filled 2024-05-06: qty 5

## 2024-05-06 MED ORDER — PROPOFOL 10 MG/ML IV BOLUS
INTRAVENOUS | Status: DC | PRN
  Administered 2024-05-06 (×2): 180 mg via INTRAVENOUS

## 2024-05-06 MED ORDER — SODIUM CHLORIDE 0.9 % IR SOLN
Status: DC | PRN
Start: 2024-05-06 — End: 2024-05-07
  Administered 2024-05-06 (×2): 3000 mL

## 2024-05-06 MED ORDER — OXYCODONE HCL 5 MG/5ML PO SOLN: 5.0000 mg | Freq: Once | ORAL | Status: DC | PRN

## 2024-05-06 MED ORDER — LACTATED RINGERS IV SOLN: INTRAVENOUS | Status: DC | PRN

## 2024-05-06 MED ORDER — LIDOCAINE 2% (20 MG/ML) 5 ML SYRINGE
INTRAMUSCULAR | Status: DC | PRN
  Administered 2024-05-06 (×2): 40 mg via INTRAVENOUS

## 2024-05-06 MED ORDER — OXYCODONE HCL 5 MG PO TABS: 5.0000 mg | ORAL_TABLET | Freq: Once | ORAL | Status: DC | PRN

## 2024-05-06 MED ORDER — SODIUM CHLORIDE 0.9 % IV SOLN
INTRAVENOUS | Status: DC | PRN
Start: 2024-05-06 — End: 2024-05-07

## 2024-05-06 MED ORDER — SODIUM CHLORIDE 0.9 % IV BOLUS
1000.0000 mL | Freq: Once | INTRAVENOUS | Status: AC
  Administered 2024-05-06 (×2): 1000 mL via INTRAVENOUS

## 2024-05-06 MED ORDER — SUCCINYLCHOLINE CHLORIDE 200 MG/10ML IV SOSY
PREFILLED_SYRINGE | INTRAVENOUS | Status: DC | PRN
  Administered 2024-05-06 (×2): 120 mg via INTRAVENOUS

## 2024-05-06 MED ORDER — PROPOFOL 10 MG/ML IV BOLUS
INTRAVENOUS | Status: AC
Start: 2024-05-06 — End: 2024-05-06
  Filled 2024-05-06: qty 20

## 2024-05-06 MED ORDER — HYDROMORPHONE HCL 1 MG/ML IJ SOLN: 0.2500 mg | INTRAMUSCULAR | Status: DC | PRN

## 2024-05-06 MED ORDER — CEFAZOLIN SODIUM-DEXTROSE 2-4 GM/100ML-% IV SOLN
2.0000 g | Freq: Once | INTRAVENOUS | Status: AC
  Administered 2024-05-06 (×2): 2 g via INTRAVENOUS

## 2024-05-06 MED ORDER — ACETAMINOPHEN 10 MG/ML IV SOLN: 1000.0000 mg | Freq: Once | INTRAVENOUS | Status: DC | PRN

## 2024-05-06 MED ORDER — IOHEXOL 350 MG/ML SOLN
75.0000 mL | Freq: Once | INTRAVENOUS | Status: AC | PRN
  Administered 2024-05-06 (×2): 75 mL via INTRAVENOUS

## 2024-05-06 MED ORDER — MIDAZOLAM HCL 2 MG/2ML IJ SOLN
INTRAMUSCULAR | Status: DC | PRN
  Administered 2024-05-06 (×2): 2 mg via INTRAVENOUS

## 2024-05-06 MED ORDER — MEPERIDINE HCL 25 MG/ML IJ SOLN: 6.2500 mg | INTRAMUSCULAR | Status: DC | PRN

## 2024-05-06 MED ORDER — 0.9 % SODIUM CHLORIDE (POUR BTL) OPTIME
TOPICAL | Status: DC | PRN
  Administered 2024-05-06 (×2): 1000 mL

## 2024-05-06 MED ORDER — MIDAZOLAM HCL 2 MG/2ML IJ SOLN
INTRAMUSCULAR | Status: AC
  Filled 2024-05-06: qty 2

## 2024-05-06 MED ORDER — TETANUS-DIPHTH-ACELL PERTUSSIS 5-2.5-18.5 LF-MCG/0.5 IM SUSY
PREFILLED_SYRINGE | INTRAMUSCULAR | Status: AC
  Administered 2024-05-06 (×2): 0.5 mL via INTRAMUSCULAR
  Filled 2024-05-06: qty 0.5

## 2024-05-06 SURGICAL SUPPLY — 49 items
BAG COUNTER SPONGE SURGICOUNT (BAG) ×1 IMPLANT
BIT DRILL QC 2.0 SHORT EVOS SM (DRILL) IMPLANT
BIT DRILL QC 2.5MM SHRT EVO SM (DRILL) IMPLANT
BNDG COHESIVE 4X5 TAN STRL LF (GAUZE/BANDAGES/DRESSINGS) ×1 IMPLANT
BNDG ELASTIC 4INX 5YD STR LF (GAUZE/BANDAGES/DRESSINGS) IMPLANT
CANISTER SUCTION 3000ML PPV (SUCTIONS) ×1 IMPLANT
COVER SURGICAL LIGHT HANDLE (MISCELLANEOUS) ×1 IMPLANT
CUFF TRNQT CYL 34X4.125X (TOURNIQUET CUFF) ×1 IMPLANT
DRAPE C-ARM 42X72 X-RAY (DRAPES) ×1 IMPLANT
DRAPE C-ARMOR (DRAPES) ×1 IMPLANT
DRAPE U-SHAPE 47X51 STRL (DRAPES) ×1 IMPLANT
DRSG XEROFORM 1X8 (GAUZE/BANDAGES/DRESSINGS) IMPLANT
DURAPREP 26ML APPLICATOR (WOUND CARE) ×1 IMPLANT
ELECT COATED BLADE 2.86 ST (ELECTRODE) ×1 IMPLANT
ELECTRODE REM PT RTRN 9FT ADLT (ELECTROSURGICAL) ×1 IMPLANT
GAUZE PAD ABD 8X10 STRL (GAUZE/BANDAGES/DRESSINGS) IMPLANT
GAUZE SPONGE 4X4 12PLY STRL (GAUZE/BANDAGES/DRESSINGS) ×1 IMPLANT
GLOVE INDICATOR 7.5 STRL GRN (GLOVE) ×1 IMPLANT
GLOVE SS BIOGEL STRL SZ 7.5 (GLOVE) ×1 IMPLANT
GOWN STRL REUS W/ TWL LRG LVL3 (GOWN DISPOSABLE) ×1 IMPLANT
GOWN STRL REUS W/ TWL XL LVL3 (GOWN DISPOSABLE) ×1 IMPLANT
GOWN STRL SURGICAL XL XLNG (GOWN DISPOSABLE) ×2 IMPLANT
KIT BASIN OR (CUSTOM PROCEDURE TRAY) ×1 IMPLANT
KIT TURNOVER KIT B (KITS) ×1 IMPLANT
KWIRE FX150X1.6XTROC PNT (WIRE) IMPLANT
NS IRRIG 1000ML POUR BTL (IV SOLUTION) ×1 IMPLANT
PACK ORTHO EXTREMITY (CUSTOM PROCEDURE TRAY) ×1 IMPLANT
PAD ARMBOARD POSITIONER FOAM (MISCELLANEOUS) ×2 IMPLANT
PAD CAST 4YDX4 CTTN HI CHSV (CAST SUPPLIES) ×2 IMPLANT
PLATE 5H L 81MM FIBULA EVOS (Plate) IMPLANT
PLATE TIB PA EVOS 2.7X64 3H (Plate) IMPLANT
SCREW CORT 2.7X16 STAR T8 EVOS (Screw) IMPLANT
SCREW CORT 3.5X14 ST EVOS (Screw) IMPLANT
SCREW CORT 3.5X17 ST EVOS (Screw) IMPLANT
SCREW CORT 3.5X30 ST EVOS (Screw) IMPLANT
SCREW CORT EVOS ST 3.5X12 (Screw) IMPLANT
SCREW EVOS 2.7X18 LOCK T8 (Screw) IMPLANT
SCREW LOCK CORTICAL 3.5X42 (Screw) IMPLANT
SPLINT PLASTER CAST XFAST 5X30 (CAST SUPPLIES) IMPLANT
SPONGE T-LAP 18X18 ~~LOC~~+RFID (SPONGE) ×1 IMPLANT
SUCTION TUBE FRAZIER 10FR DISP (SUCTIONS) ×1 IMPLANT
SUT ETHILON 2 0 FS 18 (SUTURE) ×1 IMPLANT
SUT ETHILON 2 0 PSLX (SUTURE) IMPLANT
SUT PDS AB 2-0 CT1 27 (SUTURE) IMPLANT
SUT VIC AB 2-0 CT1 18 (SUTURE) ×1 IMPLANT
SUT VIC AB 2-0 CT2 18 VCP726D (SUTURE) ×1 IMPLANT
TOWEL GREEN STERILE (TOWEL DISPOSABLE) ×1 IMPLANT
TOWEL GREEN STERILE FF (TOWEL DISPOSABLE) ×1 IMPLANT
TUBE CONNECTING 12X1/4 (SUCTIONS) ×1 IMPLANT

## 2024-05-06 NOTE — Anesthesia Preprocedure Evaluation (Signed)
 Anesthesia Evaluation  Patient identified by MRN, date of birth, ID band Patient awake    Reviewed: Allergy & Precautions, NPO status , Patient's Chart, lab work & pertinent test results  Airway Mallampati: I       Dental no notable dental hx.    Pulmonary neg pulmonary ROS   Pulmonary exam normal        Cardiovascular negative cardio ROS Normal cardiovascular exam     Neuro/Psych negative neurological ROS  negative psych ROS   GI/Hepatic Neg liver ROS,GERD  Medicated,,  Endo/Other  negative endocrine ROS    Renal/GU negative Renal ROS     Musculoskeletal negative musculoskeletal ROS (+)    Abdominal   Peds  Hematology negative hematology ROS (+)   Anesthesia Other Findings   Reproductive/Obstetrics                              Anesthesia Physical Anesthesia Plan  ASA: 2 and emergent  Anesthesia Plan: General   Post-op Pain Management: Ofirmev  IV (intra-op)* and Toradol  IV (intra-op)*   Induction: Intravenous, Rapid sequence and Cricoid pressure planned  PONV Risk Score and Plan: 3 and Ondansetron , Dexamethasone  and Midazolam   Airway Management Planned: Oral ETT  Additional Equipment: None  Intra-op Plan:   Post-operative Plan: Extubation in OR  Informed Consent: I have reviewed the patients History and Physical, chart, labs and discussed the procedure including the risks, benefits and alternatives for the proposed anesthesia with the patient or authorized representative who has indicated his/her understanding and acceptance.     Dental advisory given  Plan Discussed with: CRNA  Anesthesia Plan Comments:         Anesthesia Quick Evaluation

## 2024-05-06 NOTE — Progress Notes (Signed)
 Orthopedic Tech Progress Note Patient Details:  Scott [name] 2001-01-02 [id number]  Ortho Devices Type of Ortho Device: Stirrup splint, Post (short leg) splint Ortho Device/Splint Interventions: Ordered, Application, Adjustment   Post Interventions Patient Tolerated: Well Stabilizing splint applied to hold ankle in place until patient could be taken to the OR. Laymon [name] 05/06/2024, 10:26 PM
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
## 2024-05-06 NOTE — Progress Notes (Signed)
 Transition of Care [facility]) - CAGE-AID Screening   Patient Details  Name: [name] MRN: [id number] Date of Birth: [birth year]  [facility] CM/SW Contact:    Sallyanne MALVA Mettle, RN Phone Number: 05/06/2024, 10:39 PM   Clinical Narrative:  Reports smoking, denies alcohol and drug use, no further screening indicated, no resources indicated.  CAGE-AID Screening:    Have You Ever Felt You Ought to Cut Down on Your Drinking or Drug Use?: No Have People Annoyed You By Critizing Your Drinking Or Drug Use?: No Have You Felt Bad Or Guilty About Your Drinking Or Drug Use?: No Have You Ever Had a Drink or Used Drugs First Thing In The Morning to Steady Your Nerves or to Get Rid of a Hangover?: No CAGE-AID Score: 0  Substance Abuse Education Offered: No
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
## 2024-05-06 NOTE — Anesthesia Procedure Notes (Signed)
 Procedure Name: Intubation Date/Time: 05/06/2024 11:13 PM  Performed by: Raidon Swanner C., CRNAPre-anesthesia Checklist: Patient identified, Emergency Drugs available, Suction available, Patient being monitored and Timeout performed Patient Re-evaluated:Patient Re-evaluated prior to induction Oxygen Delivery Method: Circle system utilized Preoxygenation: Pre-oxygenation with 100% oxygen Induction Type: Rapid sequence, IV induction and Cricoid Pressure applied Laryngoscope Size: Mac and 4 Grade View: Grade I Tube type: Oral Tube size: 7.5 mm Number of attempts: 1 Airway Equipment and Method: Stylet Placement Confirmation: ETT inserted through vocal cords under direct vision, positive ETCO2 and breath sounds checked- equal and bilateral Secured at: 23 cm Tube secured with: Tape Dental Injury: Teeth and Oropharynx as per pre-operative assessment

## 2024-05-07 ENCOUNTER — Encounter (HOSPITAL_COMMUNITY): Payer: Self-pay | Admitting: Orthopedic Surgery

## 2024-05-07 ENCOUNTER — Inpatient Hospital Stay (HOSPITAL_COMMUNITY)

## 2024-05-07 ENCOUNTER — Observation Stay (HOSPITAL_COMMUNITY)

## 2024-05-07 ENCOUNTER — Other Ambulatory Visit: Payer: Self-pay

## 2024-05-07 ENCOUNTER — Other Ambulatory Visit (HOSPITAL_COMMUNITY): Payer: Self-pay

## 2024-05-07 DIAGNOSIS — S82892B Other fracture of left lower leg, initial encounter for open fracture type I or II: Secondary | ICD-10-CM | POA: Diagnosis present

## 2024-05-07 LAB — BASIC METABOLIC PANEL WITH GFR
Anion gap: 11 (ref 5–15)
BUN: 8 mg/dL (ref 6–20)
CO2: 23 mmol/L (ref 22–32)
Calcium: 9.4 mg/dL (ref 8.9–10.3)
Chloride: 102 mmol/L (ref 98–111)
Creatinine, Ser: 1.06 mg/dL (ref 0.61–1.24)
GFR, Estimated: >60 mL/min (ref >60)
Glucose, Bld: 127 mg/dL — ABNORMAL HIGH (ref 70–99)
Potassium: 4.3 mmol/L (ref 3.5–5.1)
Sodium: 136 mmol/L (ref 135–145)

## 2024-05-07 LAB — PROTIME-INR
INR: 1.1 (ref 0.8–1.2)
Prothrombin Time: 15 s (ref 11.4–15.2)

## 2024-05-07 MED ORDER — POLYETHYLENE GLYCOL 3350 17 GM/SCOOP PO POWD
17.0000 g | Freq: Every day | ORAL | 0 refills | Status: AC
  Filled 2024-05-07: qty 238, 14d supply, fill #0

## 2024-05-07 MED ORDER — TRANEXAMIC ACID-NACL 1000-0.7 MG/100ML-% IV SOLN
1000.0000 mg | Freq: Once | INTRAVENOUS | Status: AC
  Administered 2024-05-07 (×2): 1000 mg via INTRAVENOUS
  Filled 2024-05-07: qty 100

## 2024-05-07 MED ORDER — OXYCODONE HCL 5 MG PO TABS
5.0000 mg | ORAL_TABLET | ORAL | Status: DC | PRN
  Administered 2024-05-07 (×3): 10 mg via ORAL
  Administered 2024-05-07 (×2): 5 mg via ORAL
  Administered 2024-05-07: 10 mg via ORAL
  Filled 2024-05-07: qty 2
  Filled 2024-05-07: qty 1
  Filled 2024-05-07: qty 2

## 2024-05-07 MED ORDER — ACETAMINOPHEN 500 MG PO TABS
1000.0000 mg | ORAL_TABLET | Freq: Three times a day (TID) | ORAL | Status: DC
  Administered 2024-05-07 (×4): 1000 mg via ORAL
  Filled 2024-05-07 (×2): qty 2

## 2024-05-07 MED ORDER — PANTOPRAZOLE SODIUM 40 MG PO TBEC: 40.0000 mg | DELAYED_RELEASE_TABLET | Freq: Every day | ORAL | Status: DC | PRN

## 2024-05-07 MED ORDER — ASPIRIN 81 MG PO CHEW
81.0000 mg | CHEWABLE_TABLET | Freq: Two times a day (BID) | ORAL | 0 refills | Status: AC
  Filled 2024-05-07: qty 84, 42d supply, fill #0

## 2024-05-07 MED ORDER — ACETAMINOPHEN 10 MG/ML IV SOLN
INTRAVENOUS | Status: AC
  Filled 2024-05-07: qty 100

## 2024-05-07 MED ORDER — ACETAMINOPHEN 500 MG PO TABS
1000.0000 mg | ORAL_TABLET | Freq: Three times a day (TID) | ORAL | 0 refills | Status: AC
  Filled 2024-05-07: qty 84, 14d supply, fill #0

## 2024-05-07 MED ORDER — OXYCODONE HCL 5 MG PO TABS
5.0000 mg | ORAL_TABLET | ORAL | 0 refills | Status: DC | PRN
  Filled 2024-05-07: qty 30, 5d supply, fill #0

## 2024-05-07 MED ORDER — SENNA 8.6 MG PO TABS
1.0000 | ORAL_TABLET | Freq: Two times a day (BID) | ORAL | 0 refills | Status: AC
  Filled 2024-05-07: qty 28, 14d supply, fill #0

## 2024-05-07 MED ORDER — KETOROLAC TROMETHAMINE 30 MG/ML IJ SOLN
INTRAMUSCULAR | Status: DC | PRN
  Administered 2024-05-07 (×2): 30 mg via INTRAVENOUS

## 2024-05-07 MED ORDER — ACETAMINOPHEN 10 MG/ML IV SOLN
INTRAVENOUS | Status: DC | PRN
  Administered 2024-05-07 (×2): 1000 mg via INTRAVENOUS

## 2024-05-07 MED ORDER — ONDANSETRON HCL 4 MG/2ML IJ SOLN
INTRAMUSCULAR | Status: DC | PRN
  Administered 2024-05-07 (×2): 4 mg via INTRAVENOUS

## 2024-05-07 MED ORDER — DEXMEDETOMIDINE HCL IN NACL 80 MCG/20ML IV SOLN
INTRAVENOUS | Status: DC | PRN
Start: 2024-05-07 — End: 2024-05-07
  Administered 2024-05-07: 4 ug via INTRAVENOUS
  Administered 2024-05-07: 8 ug via INTRAVENOUS
  Administered 2024-05-07: 4 ug via INTRAVENOUS
  Administered 2024-05-07 (×3): 8 ug via INTRAVENOUS

## 2024-05-07 MED ORDER — VANCOMYCIN HCL 1000 MG IV SOLR
INTRAVENOUS | Status: DC | PRN
  Administered 2024-05-07 (×2): 1000 mg via TOPICAL

## 2024-05-07 MED ORDER — HYDROMORPHONE HCL 1 MG/ML IJ SOLN
0.5000 mg | INTRAMUSCULAR | Status: AC | PRN
  Administered 2024-05-07 (×2): 0.5 mg via INTRAVENOUS
  Filled 2024-05-07: qty 0.5

## 2024-05-07 MED ORDER — ONDANSETRON HCL 4 MG/2ML IJ SOLN
4.0000 mg | Freq: Four times a day (QID) | INTRAMUSCULAR | Status: DC | PRN
  Administered 2024-05-07 (×2): 4 mg via INTRAVENOUS
  Filled 2024-05-07: qty 2

## 2024-05-07 MED ORDER — POLYETHYLENE GLYCOL 3350 17 G PO PACK
17.0000 g | PACK | Freq: Every day | ORAL | Status: DC
  Administered 2024-05-07 (×2): 17 g via ORAL
  Filled 2024-05-07: qty 1

## 2024-05-07 MED ORDER — ASPIRIN 81 MG PO CHEW
81.0000 mg | CHEWABLE_TABLET | Freq: Two times a day (BID) | ORAL | Status: DC
  Administered 2024-05-07 (×2): 81 mg via ORAL
  Filled 2024-05-07: qty 1

## 2024-05-07 MED ORDER — SODIUM CHLORIDE 0.9 % IV SOLN
1.0000 g | INTRAVENOUS | Status: AC
  Administered 2024-05-07 (×2): 1 g via INTRAVENOUS
  Filled 2024-05-07: qty 10

## 2024-05-07 MED ORDER — SENNA 8.6 MG PO TABS
1.0000 | ORAL_TABLET | Freq: Two times a day (BID) | ORAL | Status: DC
  Administered 2024-05-07 (×2): 8.6 mg via ORAL
  Filled 2024-05-07: qty 1

## 2024-05-07 MED ORDER — SUGAMMADEX SODIUM 200 MG/2ML IV SOLN
INTRAVENOUS | Status: DC | PRN
  Administered 2024-05-07 (×4): 100 mg via INTRAVENOUS

## 2024-05-07 MED ORDER — VANCOMYCIN HCL 1000 MG IV SOLR
INTRAVENOUS | Status: AC
  Filled 2024-05-07: qty 20

## 2024-05-07 MED ORDER — ONDANSETRON HCL 4 MG PO TABS: 4.0000 mg | ORAL_TABLET | Freq: Four times a day (QID) | ORAL | Status: DC | PRN

## 2024-05-07 NOTE — Care Management (Signed)
 Received a call from Dorothe, RN in the Discharge Lounge, regarding a patient awaiting a rolling walker from [facility] for discharge. Upon review, it was noted that no order had been placed for the rolling walker. A secured chat message was sent to the RN caring for the patient, requesting that the MD be notified to place the order. Once received, I can fax the order to [facility] after-hours department.  [facility]'s After-Hours Department closes at 7:00 PM. A handoff will be left for Unit Care Manager [facility] to follow up in the morning.

## 2024-05-07 NOTE — Anesthesia Postprocedure Evaluation (Signed)
 Anesthesia Post Note  Patient: Scott [name]  Procedure(s) Performed: OPEN REDUCTION INTERNAL FIXATION (ORIF) ANKLE FRACTURE (Left: Ankle)     Patient location during evaluation: PACU Anesthesia Type: General Level of consciousness: awake and alert Pain management: pain level controlled Vital Signs Assessment: post-procedure vital signs reviewed and stable Respiratory status: spontaneous breathing, nonlabored ventilation, respiratory function stable and patient connected to nasal cannula oxygen Cardiovascular status: blood pressure returned to baseline and stable Postop Assessment: no apparent nausea or vomiting Anesthetic complications: no   No notable events documented.  Last Vitals:  Vitals:   05/07/24 0251 05/07/24 0744  BP: (!) 132/101 120/76  Pulse: 71 96  Resp: 18 18  Temp: 36.8 C 36.4 C  SpO2: 99% 99%    Last Pain:  Vitals:   05/07/24 0843  TempSrc:   PainSc: 6                  [name]

## 2024-05-07 NOTE — Discharge Instructions (Signed)
 Orthopedic Surgery Discharge Instructions  Patient name: Scott [name] Procedure performed: Left open ankle fracture irrigation and debridement with open reduction and internal fixation Fracture: Left open ankle fracture Date of Surgery: 05/06/2024 Surgeon: Ozell Ada, MD  Activity: You should be nonweightbearing on the left lower extremity in the splint at all times.  The plates and screws used to fix your ankle are not designed to put weight through them.  Incision Care: Your incisions have a splint over them.  The splint is made of plaster and it needs to be kept dry at all times.  When bathing, you need to use a waterproof bag to prevent water from getting on the splint.  You can bathe as long as you do keep it dry at all times.  Elevation is important in the first 2 weeks after surgery.  Keeping the leg above the level of the heart frequently will decrease the swelling which will help with wound healing and pain.  Medications: You have been prescribed oxycodone . This is a narcotic pain medication and should only be taken as prescribed. You should not drink alcohol or operate heavy machinery (including driving) while taking this medication. The oxycodone  can cause constipation as a side effect. For that reason, you have been prescribed senna and miralax . These are both laxatives. You do not need to take this medication if you develop diarrhea. Should you remain constipated even while taking the senna and miralax , please use the miralax  twice daily. Tylenol  has been prescribed to be taken every 8 hours, which will give you additional pain relief.   You have been prescribed aspirin  as a blood thinner. This medication is to be taken to prevent blood clots. Take 81 milligrams twice daily. You should refrain from using other blood thinners (warfarin, apixaban, plavix, xarelto, etc.) while using the aspirin . You will need to take this medication for a total of 6 weeks after your surgery.   You  should not use over-the-counter NSAIDs (ibuprofen , Aleve, Celebrex, naproxen, meloxicam, etc.) for pain relief because aspirin  is a similar medication. There can be side effects including but not limited to kidney injury and ulcers if you take these type of medications with the aspirin .  In order to set expectations for opioid prescriptions, you will only be prescribed opioids for a total of six weeks after surgery and, at two-weeks after surgery, your opioid prescription will start to tapered (decreased dosage and number of pills). If you have ongoing need for opioid medication six weeks after surgery, you will be referred to pain management. If you are already established with a provider that is giving you opioid medications, you should schedule an appointment with them for six weeks after surgery if you feel you are going to need another prescription. State law only allows for opioid prescriptions one week at a time. If you are running out of opioid medication near the end of the week, please call the office during business hours before running out so I can send you another prescription.   Driving: You should not drive while taking narcotic pain medications. You should start getting back to driving slowly and you may want to try driving in a parking lot before doing anything more.   Diet: You are safe to resume your regular diet after surgery.   Reasons to Call the Office After Surgery: You should feel free to call the office with any concerns or questions you have in the post-operative period, but you should definitely notify the office  if you develop: -shortness of breath, chest pain, or trouble breathing -excessive bleeding, drainage, redness, or swelling around the surgical site -fevers, chills, or pain that is getting worse with each passing day -persistent nausea or vomiting -new weakness in the left leg, new or worsening numbness or tingling in the left leg -other concerns about your  surgery  Follow Up Appointments: You have a follow-up appointment scheduled with Dr. Georgina on 05/21/2024 at 10:45 AM.  The office location and phone number listed below.  Please arrive on time to this appointment.  Office Information:  -Ozell Georgina, MD -Phone number: [phone] -Address: [address]       [place], [place] [address]

## 2024-05-07 NOTE — Progress Notes (Signed)
 Orthopedic Surgery Progress Note   Assessment: Patient is a 23 y.o. male with left open ankle fracture status post ORIF and I&D   Plan: -Operative plans: complete -Diet: Regular -DVT ppx: aspirin  81mg  BID -Antibiotics: Ceftriaxone  postoperative doses -Weight bearing status: NWB LLE in splint -PT evaluate and treat -Pain control - Anticipate discharge to home today ___________________________________________________________________________  Subjective: No acute events overnight.  Not having any significant pain in the ankle.  Pain is well-controlled with current medications.   Physical Exam:  General: no acute distress, appears stated age Neurologic: alert, answering questions appropriately, following commands Respiratory: unlabored breathing on room air, symmetric chest rise Psychiatric: appropriate affect, normal cadence to speech  MSK:    -Left lower extremity  Splint in place with no drainage around it EHL/TA/GSC intact Plantarflexes and dorsiflexes toes Sensation intact to light touch in sural, saphenous, tibial, deep peroneal, and superficial peroneal nerve distributions Foot warm and well perfused   Yesterday's total administered Morphine  [place]ig[place] Equivalents: 57   Patient name: Scott [name] Patient MRN: [id number] Date: 05/07/24
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
## 2024-05-07 NOTE — Progress Notes (Signed)
 AVS education given to patient and family.  Patient and mother expressed verbal understanding of discharge plan of care.  PIV removed dressing intact. TOC meds with patient,     Ernie RN updated Needs. DME RW Unable to reach [employer]

## 2024-05-07 NOTE — TOC Transition Note (Signed)
 Transition of Care [facility]) - Discharge Note   Patient Details  Name: Scott [name] MRN: [id number] Date of Birth: 06/15/2001  [facility]) CM/SW Contact:  Rosalva Jon Bloch, RN Phone Number: 05/07/2024, 1:17 PM   Clinical Narrative:    Patient will DC un:ynfz Anticipated DC date: 05/07/2024 Family notified: yes Transport by: car  Presents with left open ankle fracture. Pt s/p ORIF and I&D 8/12.  Per MD patient ready for DC today. RN, patient, and patient's family, aware of DC. Mom to assist with care once home.    Referral for DME made with [employer]. Equipment will be delivered to bedside prior to d/c. Post hospital f/u noted on AVS. Provider to f/u with pt @ f/u regarding therapy needs. Family to provide transportation to home. Pt without RX MED concerns RNCM will sign off for now as intervention is no longer needed. Please consult us  again if new needs arise.   Final next level of care: Home/Self Care Barriers to Discharge: No Barriers Identified   Patient Goals and CMS Choice     Choice offered to / list presented to : Patient      Discharge Placement                       Discharge Plan and Services Additional resources added to the After Visit Summary for                  DME Arranged: Bedside commode, Other see comment (tub/shower bench) DME Agency: [employer] Date DME Agency Contacted: 05/07/24 Time DME Agency Contacted: 1203 Representative spoke with at DME Agency: Darlyn            Social Drivers of Health (SDOH) Interventions SDOH Screenings   Food Insecurity: No Food Insecurity (05/07/2024)  Housing: Low Risk  (05/07/2024)  Transportation Needs: No Transportation Needs (05/07/2024)  Utilities: Not At Risk (05/07/2024)  Tobacco Use: High Risk (05/07/2024)     Readmission Risk Interventions     No data to display

## 2024-05-07 NOTE — Progress Notes (Addendum)
    Durable Medical Equipment  (From admission, onward)           Start     Ordered   05/07/24 1154  For home use only DME Tub bench  Once        05/07/24 1154   05/07/24 1153  For home use only DME Bedside commode  Once       Comments: Confine to one room  Question:  Patient needs a bedside commode to treat with the following condition  Answer:  Fx   05/07/24 1154

## 2024-05-07 NOTE — Transfer of Care (Signed)
 Immediate Anesthesia Transfer of Care Note  Patient: Scott [name]  Procedure(s) Performed: OPEN REDUCTION INTERNAL FIXATION (ORIF) ANKLE FRACTURE (Left: Ankle)  Patient Location: PACU  Anesthesia Type:General  Level of Consciousness: awake and drowsy  Airway & Oxygen Therapy: Patient Spontanous Breathing and Patient connected to nasal cannula oxygen  Post-op Assessment: Report given to RN and Post -op Vital signs reviewed and stable  Post vital signs: Reviewed and stable  Last Vitals:  Vitals Value Taken Time  BP 117/83 05/07/24 02:16  Temp    Pulse 83 05/07/24 02:20  Resp 19 05/07/24 02:20  SpO2 98 % 05/07/24 02:20  Vitals shown include unfiled device data.  Last Pain:  Vitals:   05/06/24 2146  PainSc: 7          Complications: No notable events documented.

## 2024-05-07 NOTE — Plan of Care (Signed)

## 2024-05-07 NOTE — Progress Notes (Signed)
 Patient's mother requested the Rolling walker to be sent to home and she will use her father's walker until.  Also patient's family requested that Physical therapy come to the home.    Angelito Charge RN  and Animal nutritionist updated.
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
## 2024-05-07 NOTE — Brief Op Note (Signed)
 05/06/2024 - 05/07/2024  2:12 AM  PATIENT:  Scott [name]  23 y.o. male  PRE-OPERATIVE DIAGNOSIS:  Left Open Ankle Fracture  POST-OPERATIVE DIAGNOSIS:  Left Open Ankle Fracture  PROCEDURE:  Procedure(s): OPEN REDUCTION INTERNAL FIXATION (ORIF) ANKLE FRACTURE (Left) Left open ankle fracture irrigation and debridement  SURGEON:  Surgeons and Role:    * Georgina Ozell LABOR, MD - Primary  PHYSICIAN ASSISTANT: none  ASSISTANTS: none   ANESTHESIA:   general  EBL:  100 mL   BLOOD ADMINISTERED:none  DRAINS: none   LOCAL MEDICATIONS USED:  NONE  SPECIMEN:  No Specimen  DISPOSITION OF SPECIMEN:  N/A  COUNTS:  YES  TOURNIQUET: NONE  DICTATION: .Note written in EPIC  PLAN OF CARE: Admit to inpatient   PATIENT DISPOSITION:  PACU - hemodynamically stable.   Delay start of Pharmacological VTE agent (>24hrs) due to surgical blood loss or risk of bleeding: no

## 2024-05-08 ENCOUNTER — Encounter (HOSPITAL_COMMUNITY): Payer: Self-pay | Admitting: Orthopedic Surgery

## 2024-05-08 ENCOUNTER — Telehealth: Payer: Self-pay

## 2024-05-08 NOTE — Telephone Encounter (Signed)
 I called and lmom for pt that the rx was at the front desk to the left of the front door. I advised that he could send someone to pick it up.

## 2024-05-08 NOTE — Telephone Encounter (Signed)
 Patient called triage phone stating they never received walker Also, asking about HHPT, weightbearing status, etc. Would like a call back

## 2024-05-14 ENCOUNTER — Telehealth: Payer: Self-pay

## 2024-05-14 MED ORDER — OXYCODONE HCL 5 MG PO TABS: 5.0000 mg | ORAL_TABLET | ORAL | 0 refills | Status: DC | PRN

## 2024-05-14 NOTE — Telephone Encounter (Signed)
 Tiffany Love (patient's mother) called triage. He had ORIF of left ankle on 05/06/2024 with Dr.Moore. He is currently taking oxycodone  5mg  and needs a refill.  Pharmacy- [facility] Call back #[phone]

## 2024-05-14 NOTE — Addendum Note (Signed)
 Addended by: GEORGINA SHARPER on: 05/14/2024 12:17 PM   Modules accepted: Orders

## 2024-05-21 ENCOUNTER — Other Ambulatory Visit (INDEPENDENT_AMBULATORY_CARE_PROVIDER_SITE_OTHER): Payer: Self-pay

## 2024-05-21 ENCOUNTER — Ambulatory Visit (INDEPENDENT_AMBULATORY_CARE_PROVIDER_SITE_OTHER): Admitting: Orthopedic Surgery

## 2024-05-21 DIAGNOSIS — S82892E Other fracture of left lower leg, subsequent encounter for open fracture type I or II with routine healing: Secondary | ICD-10-CM

## 2024-05-21 MED ORDER — OXYCODONE HCL 5 MG PO TABS: 5.0000 mg | ORAL_TABLET | ORAL | 0 refills | Status: AC | PRN

## 2024-05-21 NOTE — Progress Notes (Signed)
 Orthopedic Surgery Post-operative Office Visit  Procedure: left ankle fracture ORIF Date of Surgery: 05/06/2024 (~2 weeks post-op)  Assessment: Patient is a 23 y.o. who is doing as expected after surgery   Plan: -Operative plans complete -Sutures removed today in office -Okay to let soap/water run over the incision but do not submerge -Non weight bearing left lower extremity in boot -Pain management: weaning oxycodone  -Return to office in 4 weeks, x-rays needed at next visit: AP/lateral/mortise   ___________________________________________________________________________   Subjective: Patient is still having some ankle pain but it has been slowly getting better with time.  He has been in a splint but has not noticed any drainage around the splint.  He has been nonweightbearing.  He has been using the oxycodone  mostly at night to help him sleep.  Denies paresthesias and numbness.  Objective:  General: no acute distress, appropriate affect Neurologic: alert, answering questions appropriately, following commands Respiratory: unlabored breathing on room air Skin: incisions are well-approximated with no erythema, induration, active/expressible drainage  MSK (LLE): Weakly fires TA/GSC/EHL, sensation intact to light touch in sural/saphenous/deep peroneal/superficial peroneal/tibial nerve distributions, foot warm well-perfused, palpable DP pulse   Imaging: XRs of the left ankle from 05/21/2024 were independently reviewed and interpreted, showing a small step-off at the medial malleolus fracture.  There is medial plate fixation with no lucency around the screws or the plate.  The lateral malleolus fracture appears well reduced with lateral plate fixation.  No lucency seen around the lateral screws.  No new fracture seen.   Patient name: Scott [name] Patient MRN: [id number] Date of visit: 05/21/24

## 2024-05-29 ENCOUNTER — Ambulatory Visit: Admitting: Orthopedic Surgery

## 2024-05-29 ENCOUNTER — Other Ambulatory Visit (INDEPENDENT_AMBULATORY_CARE_PROVIDER_SITE_OTHER): Payer: Self-pay

## 2024-05-29 DIAGNOSIS — M25561 Pain in right knee: Secondary | ICD-10-CM

## 2024-05-29 DIAGNOSIS — S83511A Sprain of anterior cruciate ligament of right knee, initial encounter: Secondary | ICD-10-CM | POA: Diagnosis not present

## 2024-05-29 DIAGNOSIS — Z9889 Other specified postprocedural states: Secondary | ICD-10-CM | POA: Diagnosis not present

## 2024-05-29 DIAGNOSIS — Z8781 Personal history of (healed) traumatic fracture: Secondary | ICD-10-CM | POA: Diagnosis not present

## 2024-05-29 MED ORDER — OXYCODONE HCL 5 MG PO TABS: 5.0000 mg | ORAL_TABLET | ORAL | 0 refills | Status: AC | PRN

## 2024-05-29 MED ORDER — ACETAMINOPHEN 500 MG PO TABS: 1000.0000 mg | ORAL_TABLET | Freq: Three times a day (TID) | ORAL | 0 refills | Status: AC
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
## 2024-05-30 ENCOUNTER — Encounter: Payer: Self-pay | Admitting: Orthopedic Surgery

## 2024-06-02 ENCOUNTER — Ambulatory Visit: Attending: Orthopedic Surgery | Admitting: Physical Therapy

## 2024-06-02 DIAGNOSIS — M25561 Pain in right knee: Secondary | ICD-10-CM | POA: Diagnosis present

## 2024-06-02 DIAGNOSIS — R600 Localized edema: Secondary | ICD-10-CM | POA: Insufficient documentation

## 2024-06-02 DIAGNOSIS — R2689 Other abnormalities of gait and mobility: Secondary | ICD-10-CM | POA: Diagnosis present

## 2024-06-02 DIAGNOSIS — M6281 Muscle weakness (generalized): Secondary | ICD-10-CM | POA: Insufficient documentation

## 2024-06-02 DIAGNOSIS — S83511A Sprain of anterior cruciate ligament of right knee, initial encounter: Secondary | ICD-10-CM | POA: Diagnosis not present
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
## 2024-06-04 ENCOUNTER — Encounter: Payer: Self-pay | Admitting: Physical Therapy

## 2024-06-04 ENCOUNTER — Ambulatory Visit: Admitting: Physical Therapy

## 2024-06-04 DIAGNOSIS — R600 Localized edema: Secondary | ICD-10-CM

## 2024-06-04 DIAGNOSIS — M6281 Muscle weakness (generalized): Secondary | ICD-10-CM

## 2024-06-04 DIAGNOSIS — M25561 Pain in right knee: Secondary | ICD-10-CM

## 2024-06-04 DIAGNOSIS — R2689 Other abnormalities of gait and mobility: Secondary | ICD-10-CM
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
## 2024-06-09 ENCOUNTER — Ambulatory Visit: Admitting: Physical Therapy
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
## 2024-06-10 ENCOUNTER — Telehealth: Payer: Self-pay | Admitting: Orthopedic Surgery

## 2024-06-10 ENCOUNTER — Other Ambulatory Visit

## 2024-06-10 MED ORDER — HYDROCODONE-ACETAMINOPHEN 5-325 MG PO TABS: 1.0000 | ORAL_TABLET | ORAL | 0 refills | Status: AC | PRN

## 2024-06-10 NOTE — Telephone Encounter (Signed)
 Patient's mom called. He needs a refill on all his medications.

## 2024-06-11 ENCOUNTER — Telehealth: Payer: Self-pay | Admitting: Orthopedic Surgery

## 2024-06-11 NOTE — Telephone Encounter (Signed)
 I called and advised Tiffany (pts mom) that this was done this morning.

## 2024-06-11 NOTE — Telephone Encounter (Signed)
 Patient mom called and said that the pharmacy said he has to get approval from the insurance.company. [phone]

## 2024-06-12 ENCOUNTER — Ambulatory Visit: Admitting: Physical Therapy

## 2024-06-12 ENCOUNTER — Encounter: Payer: Self-pay | Admitting: Physical Therapy

## 2024-06-12 DIAGNOSIS — M25561 Pain in right knee: Secondary | ICD-10-CM

## 2024-06-12 DIAGNOSIS — M6281 Muscle weakness (generalized): Secondary | ICD-10-CM

## 2024-06-12 DIAGNOSIS — R600 Localized edema: Secondary | ICD-10-CM

## 2024-06-12 DIAGNOSIS — R2689 Other abnormalities of gait and mobility: Secondary | ICD-10-CM
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
## 2024-06-17 ENCOUNTER — Ambulatory Visit

## 2024-06-18 ENCOUNTER — Encounter: Payer: Self-pay | Admitting: Physical Therapy

## 2024-06-18 ENCOUNTER — Ambulatory Visit (INDEPENDENT_AMBULATORY_CARE_PROVIDER_SITE_OTHER): Admitting: Orthopedic Surgery

## 2024-06-18 ENCOUNTER — Other Ambulatory Visit (INDEPENDENT_AMBULATORY_CARE_PROVIDER_SITE_OTHER): Payer: Self-pay

## 2024-06-18 ENCOUNTER — Ambulatory Visit: Admitting: Physical Therapy

## 2024-06-18 DIAGNOSIS — R2689 Other abnormalities of gait and mobility: Secondary | ICD-10-CM

## 2024-06-18 DIAGNOSIS — Z9889 Other specified postprocedural states: Secondary | ICD-10-CM | POA: Diagnosis not present

## 2024-06-18 DIAGNOSIS — Z8781 Personal history of (healed) traumatic fracture: Secondary | ICD-10-CM

## 2024-06-18 DIAGNOSIS — M25561 Pain in right knee: Secondary | ICD-10-CM

## 2024-06-18 DIAGNOSIS — M25461 Effusion, right knee: Secondary | ICD-10-CM | POA: Diagnosis not present

## 2024-06-18 DIAGNOSIS — M6281 Muscle weakness (generalized): Secondary | ICD-10-CM

## 2024-06-18 DIAGNOSIS — R600 Localized edema: Secondary | ICD-10-CM
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
## 2024-06-18 NOTE — Progress Notes (Signed)
 Orthopedic Surgery Post-operative Office Visit   Procedure: left ankle fracture ORIF Date of Surgery: 05/06/2024 (~6 weeks post-op)   Assessment: Patient is a 23 y.o. who is doing as expected after surgery. Also, reporting right knee pain. Had an effusion at our last visit which has decreased in size but is still present. Concern for possible ligamentous injury at the knee     Plan: -Operative plans complete - Okay to submerge wound at this point -Patient still with knee swelling and pain.  Continue to work with physical therapy.  Still recommending an MRI to evaluate for any ligamentous injury we will call him with the results of the MRI if he gets that done before our next visit. -Weightbearing as tolerated left lower extremity in boot -Pain management: tylenol  as needed -Return to office in 6 weeks, x-rays needed at next visit: AP/lateral/mortise   ___________________________________________________________________________     Subjective: Patient has noticed that his knee swelling has decreased since he was last in the office.  Still having some knee pain.  He has been working with physical therapy.  He notes that his range of motion has improved.  In regards to his ankle, pain has continued to improve.  He has been icing it and using Tylenol .  He has not noticed any redness or drainage around his incisions.  He has been nonweightbearing in a boot.  He has no complaints about his ankle at today's visit.   Objective:   General: no acute distress, appropriate affect Neurologic: alert, answering questions appropriately, following commands Respiratory: unlabored breathing on room air Skin: incisions are well healed with no erythema, induration, active/expressible drainage   MSK (LLE): ROM from neutral to 20 degrees of plantarflexion, EHL/TA/GSC intact, sensation intact to light touch in sural/saphenous/deep peroneal/superficial peroneal/tibial nerve distributions, foot warm  well-perfused, palpable DP pulse     Imaging: XRs of the left ankle from 05/21/2024 were independently reviewed and interpreted, showing medial and lateral plate fixation over the ankle.  There is a small step-off at the medial malleolus fracture.  The fibula fracture appears well reduced with no step-off.  No lucency seen around the screws on the medial or lateral malleolus.  No new fracture seen.     Patient name: Scott [name] Patient MRN: [id number] Date of visit: 06/18/24
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
## 2024-06-19 ENCOUNTER — Ambulatory Visit

## 2024-06-19 DIAGNOSIS — M6281 Muscle weakness (generalized): Secondary | ICD-10-CM

## 2024-06-19 DIAGNOSIS — R2689 Other abnormalities of gait and mobility: Secondary | ICD-10-CM

## 2024-06-19 DIAGNOSIS — M25561 Pain in right knee: Secondary | ICD-10-CM

## 2024-06-19 DIAGNOSIS — R600 Localized edema: Secondary | ICD-10-CM

## 2024-06-24 ENCOUNTER — Ambulatory Visit: Admitting: Physical Therapy

## 2024-06-24 ENCOUNTER — Encounter: Admitting: Physical Therapy

## 2024-06-25 ENCOUNTER — Ambulatory Visit: Attending: Orthopedic Surgery | Admitting: Physical Therapy

## 2024-06-25 ENCOUNTER — Encounter: Payer: Self-pay | Admitting: Physical Therapy

## 2024-06-25 DIAGNOSIS — M25561 Pain in right knee: Secondary | ICD-10-CM | POA: Diagnosis present

## 2024-06-25 DIAGNOSIS — M6281 Muscle weakness (generalized): Secondary | ICD-10-CM | POA: Diagnosis present

## 2024-06-25 DIAGNOSIS — R2689 Other abnormalities of gait and mobility: Secondary | ICD-10-CM | POA: Diagnosis present

## 2024-06-25 DIAGNOSIS — R600 Localized edema: Secondary | ICD-10-CM | POA: Insufficient documentation
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
## 2024-06-26 ENCOUNTER — Encounter: Payer: Self-pay | Admitting: Physical Therapy

## 2024-06-26 ENCOUNTER — Ambulatory Visit: Admitting: Physical Therapy

## 2024-06-26 DIAGNOSIS — R600 Localized edema: Secondary | ICD-10-CM

## 2024-06-26 DIAGNOSIS — M6281 Muscle weakness (generalized): Secondary | ICD-10-CM

## 2024-06-26 DIAGNOSIS — M25561 Pain in right knee: Secondary | ICD-10-CM | POA: Diagnosis not present

## 2024-06-26 DIAGNOSIS — R2689 Other abnormalities of gait and mobility: Secondary | ICD-10-CM
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
## 2024-06-27 ENCOUNTER — Telehealth: Payer: Self-pay | Admitting: Orthopedic Surgery

## 2024-06-27 MED ORDER — HYDROCODONE-ACETAMINOPHEN 5-325 MG PO TABS: 1.0000 | ORAL_TABLET | ORAL | 0 refills | Status: AC | PRN

## 2024-06-27 NOTE — Addendum Note (Signed)
 Addended by: GEORGINA SHARPER on: 06/27/2024 04:23 PM   Modules accepted: Orders

## 2024-06-27 NOTE — Telephone Encounter (Signed)
 Patient called and said he needs a refill on pain medication. CB#[phone]

## 2024-07-01 ENCOUNTER — Ambulatory Visit: Admitting: Physical Therapy

## 2024-07-01 ENCOUNTER — Encounter: Payer: Self-pay | Admitting: Physical Therapy

## 2024-07-01 DIAGNOSIS — R2689 Other abnormalities of gait and mobility: Secondary | ICD-10-CM

## 2024-07-01 DIAGNOSIS — M25561 Pain in right knee: Secondary | ICD-10-CM | POA: Diagnosis not present

## 2024-07-01 DIAGNOSIS — M6281 Muscle weakness (generalized): Secondary | ICD-10-CM

## 2024-07-01 DIAGNOSIS — R600 Localized edema: Secondary | ICD-10-CM
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
## 2024-07-03 ENCOUNTER — Telehealth: Payer: Self-pay | Admitting: Orthopedic Surgery

## 2024-07-03 ENCOUNTER — Ambulatory Visit: Admitting: Physical Therapy

## 2024-07-03 ENCOUNTER — Encounter: Payer: Self-pay | Admitting: Physical Therapy

## 2024-07-03 DIAGNOSIS — R2689 Other abnormalities of gait and mobility: Secondary | ICD-10-CM

## 2024-07-03 DIAGNOSIS — M25561 Pain in right knee: Secondary | ICD-10-CM | POA: Diagnosis not present

## 2024-07-03 DIAGNOSIS — R600 Localized edema: Secondary | ICD-10-CM

## 2024-07-03 DIAGNOSIS — M23611 Other spontaneous disruption of anterior cruciate ligament of right knee: Secondary | ICD-10-CM

## 2024-07-03 DIAGNOSIS — M6281 Muscle weakness (generalized): Secondary | ICD-10-CM

## 2024-07-03 NOTE — Telephone Encounter (Signed)
 Pt called requesting to continue physical therapy session. Please send another referral for continued PT. Pt phone number is [phone].
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
## 2024-07-07 ENCOUNTER — Other Ambulatory Visit

## 2024-07-07 ENCOUNTER — Telehealth: Payer: Self-pay | Admitting: Orthopedic Surgery

## 2024-07-07 NOTE — Telephone Encounter (Signed)
 Pt called stating that he wants his therapy changed form his rt knee to left ankle. Pt call back number is [phone]

## 2024-07-08 ENCOUNTER — Other Ambulatory Visit: Payer: Self-pay | Admitting: Orthopedic Surgery

## 2024-07-08 DIAGNOSIS — S82892E Other fracture of left lower leg, subsequent encounter for open fracture type I or II with routine healing: Secondary | ICD-10-CM

## 2024-07-22 ENCOUNTER — Inpatient Hospital Stay: Admission: RE | Admit: 2024-07-22 | Source: Ambulatory Visit

## 2024-07-28 ENCOUNTER — Encounter: Payer: Self-pay | Admitting: Radiology

## 2024-07-29 ENCOUNTER — Encounter: Payer: Self-pay | Admitting: Physical Therapy

## 2024-07-29 ENCOUNTER — Other Ambulatory Visit: Payer: Self-pay

## 2024-07-29 ENCOUNTER — Ambulatory Visit: Attending: Orthopedic Surgery | Admitting: Physical Therapy

## 2024-07-29 DIAGNOSIS — M25572 Pain in left ankle and joints of left foot: Secondary | ICD-10-CM | POA: Diagnosis present

## 2024-07-29 DIAGNOSIS — M6281 Muscle weakness (generalized): Secondary | ICD-10-CM | POA: Insufficient documentation

## 2024-07-29 DIAGNOSIS — R600 Localized edema: Secondary | ICD-10-CM | POA: Diagnosis present

## 2024-07-29 DIAGNOSIS — R2689 Other abnormalities of gait and mobility: Secondary | ICD-10-CM | POA: Insufficient documentation

## 2024-07-29 DIAGNOSIS — S82892E Other fracture of left lower leg, subsequent encounter for open fracture type I or II with routine healing: Secondary | ICD-10-CM | POA: Insufficient documentation
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
## 2024-07-30 ENCOUNTER — Ambulatory Visit: Admitting: Orthopedic Surgery

## 2024-07-30 ENCOUNTER — Other Ambulatory Visit (INDEPENDENT_AMBULATORY_CARE_PROVIDER_SITE_OTHER): Payer: Self-pay

## 2024-07-30 DIAGNOSIS — Z9889 Other specified postprocedural states: Secondary | ICD-10-CM

## 2024-07-30 DIAGNOSIS — Z8781 Personal history of (healed) traumatic fracture: Secondary | ICD-10-CM | POA: Diagnosis not present

## 2024-07-30 NOTE — Progress Notes (Signed)
 Orthopedic Surgery Post-operative Office Visit   Procedure: left ankle fracture ORIF Date of Surgery: 05/06/2024 (~3 months post-op)   Assessment: Patient is a 23 y.o. who is doing well since surgery     Plan: -Activity as tolerated -Continue to work with PT -Weightbearing as tolerated left lower extremity in regular shoes -Pain management: tylenol  as needed -Return to office in 3 months,  x-rays needed at next visit: AP/lateral/mortise weight bearing   ___________________________________________________________________________     Subjective: Patient has been doing well since he was last seen. He is not having any pain in the ankle. He is not taking any medications for pain. He has been weight bearing in a boot. His only issue is sometimes he feels chills or cold around the ankle if he takes a cold shower or the weather is cold.   Of note, he is not having any issues - pain, swelling, instability at the right knee at this point.    Objective:   General: no acute distress, appropriate affect, smelled of marijuana Neurologic: alert, answering questions appropriately, following commands Respiratory: unlabored breathing on room air Skin: incisions are well healed with no erythema, induration, active/expressible drainage   MSK (LLE): ROM from neutral to 20 degrees of plantarflexion, EHL/TA/GSC intact, sensation intact to light touch in sural/saphenous/deep peroneal/superficial peroneal/tibial nerve distributions, foot warm well-perfused, palpable DP pulse     Imaging: XRS of the left ankle from 07/30/2024 were independently reviewed and interpreted, showing bimalleolar ankle fracture.  Lateral malleolus appears well reduced.  Minimal displacement seen at the medial malleolus.  There is medial and lateral plate fixation.  No lucency seen around the screws.  Ankle mortise appears maintained.  No dislocation seen.  No new fracture seen.     Patient name: Scott [name] Patient MRN:  [id number] Date of visit: 07/30/24

## 2024-08-11 ENCOUNTER — Telehealth: Payer: Self-pay | Admitting: Physical Therapy

## 2024-08-11 ENCOUNTER — Ambulatory Visit: Admitting: Physical Therapy

## 2024-08-11 NOTE — Telephone Encounter (Signed)
 Spoke with Elven regarding missing today's visit. He noted he thought it was at 3:15 instead of 2:15.  I reviewed the next scheduled appt day/ time and advised if he cannot make to call us  ahead of time.  He referenced he plans to attend the next visit.   Mazell Aylesworth PT, DPT, LAT, ATC  08/11/24  3:17 PM
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
## 2024-08-14 ENCOUNTER — Ambulatory Visit: Admitting: Physical Therapy

## 2024-08-14 DIAGNOSIS — M6281 Muscle weakness (generalized): Secondary | ICD-10-CM

## 2024-08-14 DIAGNOSIS — R2689 Other abnormalities of gait and mobility: Secondary | ICD-10-CM | POA: Diagnosis not present

## 2024-08-14 DIAGNOSIS — R600 Localized edema: Secondary | ICD-10-CM
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
## 2024-08-18 ENCOUNTER — Encounter: Payer: Self-pay | Admitting: Physical Therapy

## 2024-08-18 ENCOUNTER — Ambulatory Visit: Admitting: Physical Therapy

## 2024-08-18 DIAGNOSIS — R2689 Other abnormalities of gait and mobility: Secondary | ICD-10-CM | POA: Diagnosis not present

## 2024-08-18 DIAGNOSIS — R600 Localized edema: Secondary | ICD-10-CM
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
## 2024-08-19 ENCOUNTER — Ambulatory Visit: Admitting: Physical Therapy

## 2024-08-25 ENCOUNTER — Ambulatory Visit

## 2024-08-25 DIAGNOSIS — R2689 Other abnormalities of gait and mobility: Secondary | ICD-10-CM | POA: Diagnosis present

## 2024-08-25 DIAGNOSIS — M25572 Pain in left ankle and joints of left foot: Secondary | ICD-10-CM | POA: Insufficient documentation

## 2024-08-25 DIAGNOSIS — R600 Localized edema: Secondary | ICD-10-CM | POA: Insufficient documentation

## 2024-08-25 DIAGNOSIS — M6281 Muscle weakness (generalized): Secondary | ICD-10-CM | POA: Diagnosis present
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
## 2024-08-28 ENCOUNTER — Encounter: Payer: Self-pay | Admitting: Physical Therapy

## 2024-08-28 ENCOUNTER — Ambulatory Visit: Admitting: Physical Therapy

## 2024-08-28 DIAGNOSIS — R2689 Other abnormalities of gait and mobility: Secondary | ICD-10-CM

## 2024-08-28 DIAGNOSIS — R600 Localized edema: Secondary | ICD-10-CM

## 2024-08-28 DIAGNOSIS — M25572 Pain in left ankle and joints of left foot: Secondary | ICD-10-CM

## 2024-08-28 DIAGNOSIS — M6281 Muscle weakness (generalized): Secondary | ICD-10-CM
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
## 2024-09-02 ENCOUNTER — Ambulatory Visit: Admitting: Physical Therapy

## 2024-09-04 ENCOUNTER — Encounter: Payer: Self-pay | Admitting: Physical Therapy

## 2024-09-04 ENCOUNTER — Ambulatory Visit: Admitting: Physical Therapy

## 2024-09-04 DIAGNOSIS — R2689 Other abnormalities of gait and mobility: Secondary | ICD-10-CM | POA: Diagnosis not present

## 2024-09-04 DIAGNOSIS — R600 Localized edema: Secondary | ICD-10-CM
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
## 2024-09-08 ENCOUNTER — Ambulatory Visit: Admitting: Physical Therapy

## 2024-09-08 ENCOUNTER — Encounter: Payer: Self-pay | Admitting: Physical Therapy

## 2024-09-08 ENCOUNTER — Other Ambulatory Visit: Payer: Self-pay

## 2024-09-08 ENCOUNTER — Ambulatory Visit: Admitting: Orthopedic Surgery

## 2024-09-08 DIAGNOSIS — Z9889 Other specified postprocedural states: Secondary | ICD-10-CM

## 2024-09-08 DIAGNOSIS — Z8781 Personal history of (healed) traumatic fracture: Secondary | ICD-10-CM | POA: Diagnosis not present

## 2024-09-08 DIAGNOSIS — R2689 Other abnormalities of gait and mobility: Secondary | ICD-10-CM | POA: Diagnosis not present

## 2024-09-08 DIAGNOSIS — R600 Localized edema: Secondary | ICD-10-CM
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
## 2024-09-10 ENCOUNTER — Ambulatory Visit: Admitting: Physical Therapy

## 2024-09-10 ENCOUNTER — Encounter: Payer: Self-pay | Admitting: Physical Therapy

## 2024-09-10 DIAGNOSIS — R2689 Other abnormalities of gait and mobility: Secondary | ICD-10-CM | POA: Diagnosis not present

## 2024-09-10 DIAGNOSIS — R600 Localized edema: Secondary | ICD-10-CM
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
## 2024-09-15 ENCOUNTER — Ambulatory Visit: Admitting: Physical Therapy

## 2024-09-15 ENCOUNTER — Encounter: Payer: Self-pay | Admitting: Physical Therapy

## 2024-09-15 DIAGNOSIS — R2689 Other abnormalities of gait and mobility: Secondary | ICD-10-CM

## 2024-09-15 DIAGNOSIS — M25572 Pain in left ankle and joints of left foot: Secondary | ICD-10-CM

## 2024-09-15 DIAGNOSIS — M6281 Muscle weakness (generalized): Secondary | ICD-10-CM

## 2024-09-15 DIAGNOSIS — R600 Localized edema: Secondary | ICD-10-CM
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
## 2024-09-17 ENCOUNTER — Ambulatory Visit: Admitting: Physical Therapy

## 2024-09-22 ENCOUNTER — Encounter: Payer: Self-pay | Admitting: Physical Therapy

## 2024-09-22 ENCOUNTER — Ambulatory Visit: Admitting: Physical Therapy

## 2024-09-22 DIAGNOSIS — R2689 Other abnormalities of gait and mobility: Secondary | ICD-10-CM | POA: Diagnosis not present

## 2024-09-22 DIAGNOSIS — R600 Localized edema: Secondary | ICD-10-CM
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
## 2024-09-24 ENCOUNTER — Ambulatory Visit: Admitting: Physical Therapy

## 2024-09-29 ENCOUNTER — Ambulatory Visit: Payer: Self-pay | Attending: Orthopedic Surgery | Admitting: Physical Therapy

## 2024-09-29 DIAGNOSIS — R2689 Other abnormalities of gait and mobility: Secondary | ICD-10-CM | POA: Insufficient documentation

## 2024-09-29 DIAGNOSIS — M25572 Pain in left ankle and joints of left foot: Secondary | ICD-10-CM | POA: Insufficient documentation

## 2024-09-29 DIAGNOSIS — R600 Localized edema: Secondary | ICD-10-CM | POA: Insufficient documentation

## 2024-09-29 DIAGNOSIS — M6281 Muscle weakness (generalized): Secondary | ICD-10-CM | POA: Insufficient documentation

## 2024-10-06 ENCOUNTER — Ambulatory Visit: Payer: Self-pay | Admitting: Physical Therapy

## 2024-10-13 ENCOUNTER — Encounter: Payer: Self-pay | Admitting: Physical Therapy

## 2024-10-13 ENCOUNTER — Ambulatory Visit: Payer: Self-pay | Admitting: Physical Therapy

## 2024-10-13 DIAGNOSIS — R600 Localized edema: Secondary | ICD-10-CM | POA: Diagnosis present

## 2024-10-13 DIAGNOSIS — R2689 Other abnormalities of gait and mobility: Secondary | ICD-10-CM | POA: Diagnosis present

## 2024-10-13 DIAGNOSIS — M25572 Pain in left ankle and joints of left foot: Secondary | ICD-10-CM

## 2024-10-13 DIAGNOSIS — M6281 Muscle weakness (generalized): Secondary | ICD-10-CM | POA: Diagnosis present
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
## 2024-10-20 ENCOUNTER — Ambulatory Visit

## 2024-10-20 ENCOUNTER — Ambulatory Visit: Payer: Self-pay | Admitting: Physical Therapy

## 2024-12-08 ENCOUNTER — Ambulatory Visit: Admitting: Orthopedic Surgery
